# Patient Record
Sex: Female | Born: 1990 | Hispanic: Yes | Smoking: Never smoker
Health system: Southern US, Community
[De-identification: ages and names within clinical notes are randomized; demographics above are authoritative.]

## PROBLEM LIST (undated history)

## (undated) ENCOUNTER — Inpatient Hospital Stay (HOSPITAL_COMMUNITY): Payer: Self-pay

## (undated) DIAGNOSIS — R51 Headache: Secondary | ICD-10-CM

## (undated) DIAGNOSIS — D649 Anemia, unspecified: Secondary | ICD-10-CM

## (undated) DIAGNOSIS — E282 Polycystic ovarian syndrome: Secondary | ICD-10-CM

## (undated) HISTORY — PX: TONSILLECTOMY: SUR1361

---
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
## 2013-01-07 ENCOUNTER — Emergency Department (HOSPITAL_COMMUNITY)
Admission: EM | Admit: 2013-01-07 | Discharge: 2013-01-07 | Disposition: A | Discharge status: Home / Self Care | Payer: Self-pay | Attending: Emergency Medicine | Admitting: Emergency Medicine

## 2013-01-07 ENCOUNTER — Encounter (HOSPITAL_COMMUNITY): Payer: Self-pay | Admitting: *Deleted

## 2013-01-07 DIAGNOSIS — R509 Fever, unspecified: Secondary | ICD-10-CM | POA: Insufficient documentation

## 2013-01-07 DIAGNOSIS — J020 Streptococcal pharyngitis: Secondary | ICD-10-CM | POA: Insufficient documentation

## 2013-01-07 DIAGNOSIS — R51 Headache: Secondary | ICD-10-CM | POA: Insufficient documentation

## 2013-01-07 DIAGNOSIS — Z8742 Personal history of other diseases of the female genital tract: Secondary | ICD-10-CM | POA: Insufficient documentation

## 2013-01-07 DIAGNOSIS — IMO0001 Reserved for inherently not codable concepts without codable children: Secondary | ICD-10-CM | POA: Insufficient documentation

## 2013-01-07 HISTORY — DX: Polycystic ovarian syndrome: E28.2

## 2013-01-07 LAB — RAPID STREP SCREEN (MED CTR MEBANE ONLY): Streptococcus, Group A Screen (Direct): POSITIVE — AB

## 2013-01-07 MED ORDER — IBUPROFEN 800 MG PO TABS
800.0000 mg | ORAL_TABLET | Freq: Once | ORAL | Status: AC
  Administered 2013-01-07: 800 mg via ORAL
  Filled 2013-01-07: qty 1

## 2013-01-07 MED ORDER — PENICILLIN G BENZATHINE 1200000 UNIT/2ML IM SUSP
1.2000 10*6.[IU] | Freq: Once | INTRAMUSCULAR | Status: AC
  Administered 2013-01-07: 1.2 10*6.[IU] via INTRAMUSCULAR
  Filled 2013-01-07: qty 2

## 2013-01-07 NOTE — ED Provider Notes (Signed)
History     CSN: [id number]  Arrival date & time 01/07/13  1551   First MD Initiated Contact with Patient 01/07/13 1632      Chief Complaint  Patient presents with  . Sore Throat    (Consider location/radiation/quality/duration/timing/severity/associated sxs/prior treatment) Patient is a 22 y.o. female presenting with pharyngitis. The history is provided by the patient.  Sore Throat This is a new problem. The current episode started in the past 7 days. The problem occurs daily. The problem has been gradually worsening. Associated symptoms include chills, a fever, headaches, myalgias and a sore throat. Pertinent negatives include no abdominal pain, arthralgias, chest pain, coughing or neck pain. The symptoms are aggravated by swallowing. She has tried acetaminophen for the symptoms. The treatment provided no relief.    Past Medical History  Diagnosis Date  . PCOS (polycystic ovarian syndrome)     Past Surgical History  Procedure Date  . Tonsillectomy     History reviewed. No pertinent family history.  History  Substance Use Topics  . Smoking status: Never Smoker   . Smokeless tobacco: Not on file  . Alcohol Use: Yes    OB History    Grav Para Term Preterm Abortions TAB SAB Ect Mult Living                  Review of Systems  Constitutional: Positive for fever and chills. Negative for activity change.       All ROS Neg except as noted in HPI  HENT: Positive for sore throat. Negative for nosebleeds and neck pain.   Eyes: Negative for photophobia and discharge.  Respiratory: Negative for cough, shortness of breath and wheezing.   Cardiovascular: Negative for chest pain and palpitations.  Gastrointestinal: Negative for abdominal pain and blood in stool.  Genitourinary: Negative for dysuria, frequency and hematuria.  Musculoskeletal: Positive for myalgias. Negative for back pain and arthralgias.  Skin: Negative.   Neurological: Positive for headaches. Negative for  dizziness, seizures and speech difficulty.  Psychiatric/Behavioral: Negative for hallucinations and confusion.    Allergies  Review of patient's allergies indicates no known allergies.  Home Medications  No current outpatient prescriptions on file.  BP 134/81  Pulse 86  Temp 98.2 F (36.8 C) (Oral)  Resp 18  SpO2 100%  LMP 11/18/2012  Physical Exam  Nursing note and vitals reviewed. Constitutional: She is oriented to person, place, and time. She appears well-developed and well-nourished.  Non-toxic appearance.  HENT:  Head: Normocephalic.  Right Ear: Tympanic membrane and external ear normal.  Left Ear: Tympanic membrane and external ear normal.  Mouth/Throat: Uvula is midline. Posterior oropharyngeal erythema present.  Eyes: EOM and lids are normal. Pupils are equal, round, and reactive to light.  Neck: Normal range of motion. Neck supple. Carotid bruit is not present.  Cardiovascular: Normal rate, regular rhythm, normal heart sounds, intact distal pulses and normal pulses.   Pulmonary/Chest: Breath sounds normal. No respiratory distress.  Abdominal: Soft. Bowel sounds are normal. There is no tenderness. There is no guarding.  Musculoskeletal: Normal range of motion.  Lymphadenopathy:       Head (right side): No submandibular adenopathy present.       Head (left side): No submandibular adenopathy present.    She has no cervical adenopathy.  Neurological: She is alert and oriented to person, place, and time. She has normal strength. No cranial nerve deficit or sensory deficit.  Skin: Skin is warm and dry.  Psychiatric: She has a normal mood  and affect. Her speech is normal.    ED Course  Procedures (including critical care time)   Labs Reviewed  RAPID STREP SCREEN   No results found. Pulse Ox 100% on RA. WNL by my interpretation.  No diagnosis found.    MDM  I have reviewed nursing notes, vital signs, and all appropriate lab and imaging results for this  patient. Strep test is positive. Pt treated with Bicillin LA, and ibuprofen. Pt to use salt water gargles and ibuprofen. He has been instructed to wash hands frequently. She is to return if any changes or problem.       Kathie Dike, [place] 01/07/13 1750

## 2013-01-07 NOTE — ED Provider Notes (Signed)
Medical screening examination/treatment/procedure(s) were performed by non-physician practitioner and as supervising physician I was immediately available for consultation/collaboration.   Bernadine Melecio L Keaira Whitehurst, MD 01/07/13 2045 

## 2013-01-07 NOTE — ED Notes (Signed)
Sore throat for 1 week.fever last week,

## 2013-04-16 ENCOUNTER — Emergency Department (HOSPITAL_COMMUNITY): Payer: No Typology Code available for payment source

## 2013-04-16 ENCOUNTER — Encounter (HOSPITAL_COMMUNITY): Payer: Self-pay | Admitting: Emergency Medicine

## 2013-04-16 ENCOUNTER — Emergency Department (HOSPITAL_COMMUNITY)
Admission: EM | Admit: 2013-04-16 | Discharge: 2013-04-16 | Disposition: A | Discharge status: Home / Self Care | Payer: No Typology Code available for payment source | Attending: Emergency Medicine | Admitting: Emergency Medicine

## 2013-04-16 DIAGNOSIS — Z862 Personal history of diseases of the blood and blood-forming organs and certain disorders involving the immune mechanism: Secondary | ICD-10-CM | POA: Insufficient documentation

## 2013-04-16 DIAGNOSIS — S46909A Unspecified injury of unspecified muscle, fascia and tendon at shoulder and upper arm level, unspecified arm, initial encounter: Secondary | ICD-10-CM | POA: Insufficient documentation

## 2013-04-16 DIAGNOSIS — S4980XA Other specified injuries of shoulder and upper arm, unspecified arm, initial encounter: Secondary | ICD-10-CM | POA: Insufficient documentation

## 2013-04-16 DIAGNOSIS — S0993XA Unspecified injury of face, initial encounter: Secondary | ICD-10-CM | POA: Insufficient documentation

## 2013-04-16 DIAGNOSIS — IMO0002 Reserved for concepts with insufficient information to code with codable children: Secondary | ICD-10-CM | POA: Insufficient documentation

## 2013-04-16 DIAGNOSIS — S59919A Unspecified injury of unspecified forearm, initial encounter: Secondary | ICD-10-CM | POA: Insufficient documentation

## 2013-04-16 DIAGNOSIS — Y92410 Unspecified street and highway as the place of occurrence of the external cause: Secondary | ICD-10-CM | POA: Insufficient documentation

## 2013-04-16 DIAGNOSIS — Y9389 Activity, other specified: Secondary | ICD-10-CM | POA: Insufficient documentation

## 2013-04-16 DIAGNOSIS — S59909A Unspecified injury of unspecified elbow, initial encounter: Secondary | ICD-10-CM | POA: Insufficient documentation

## 2013-04-16 DIAGNOSIS — T148XXA Other injury of unspecified body region, initial encounter: Secondary | ICD-10-CM

## 2013-04-16 DIAGNOSIS — Z8639 Personal history of other endocrine, nutritional and metabolic disease: Secondary | ICD-10-CM | POA: Insufficient documentation

## 2013-04-16 DIAGNOSIS — S6990XA Unspecified injury of unspecified wrist, hand and finger(s), initial encounter: Secondary | ICD-10-CM | POA: Insufficient documentation

## 2013-04-16 MED ORDER — IBUPROFEN 800 MG PO TABS: 800.0000 mg | ORAL_TABLET | Freq: Three times a day (TID) | ORAL | Status: DC

## 2013-04-16 MED ORDER — OXYCODONE-ACETAMINOPHEN 5-325 MG PO TABS
2.0000 | ORAL_TABLET | Freq: Once | ORAL | Status: AC
  Administered 2013-04-16: 2 via ORAL
  Filled 2013-04-16: qty 2

## 2013-04-16 MED ORDER — OXYCODONE-ACETAMINOPHEN 5-325 MG PO TABS: 2.0000 | ORAL_TABLET | ORAL | Status: DC | PRN
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
## 2013-12-30 ENCOUNTER — Encounter (HOSPITAL_COMMUNITY): Payer: Self-pay | Admitting: Emergency Medicine

## 2013-12-30 ENCOUNTER — Emergency Department (HOSPITAL_COMMUNITY)
Admission: EM | Admit: 2013-12-30 | Discharge: 2013-12-30 | Disposition: A | Discharge status: Home / Self Care | Payer: No Typology Code available for payment source | Attending: Emergency Medicine | Admitting: Emergency Medicine

## 2013-12-30 DIAGNOSIS — Z791 Long term (current) use of non-steroidal anti-inflammatories (NSAID): Secondary | ICD-10-CM | POA: Insufficient documentation

## 2013-12-30 DIAGNOSIS — K118 Other diseases of salivary glands: Secondary | ICD-10-CM | POA: Insufficient documentation

## 2013-12-30 DIAGNOSIS — Z8742 Personal history of other diseases of the female genital tract: Secondary | ICD-10-CM | POA: Insufficient documentation

## 2013-12-30 DIAGNOSIS — R609 Edema, unspecified: Secondary | ICD-10-CM

## 2013-12-30 DIAGNOSIS — H9209 Otalgia, unspecified ear: Secondary | ICD-10-CM | POA: Insufficient documentation

## 2013-12-30 MED ORDER — CEPHALEXIN 500 MG PO CAPS: 500.0000 mg | ORAL_CAPSULE | Freq: Four times a day (QID) | ORAL | Status: DC

## 2013-12-30 NOTE — Discharge Instructions (Signed)
Parotitis °Parotitis is soreness and swelling (inflammation) of one or both parotid glands. The parotid glands produce saliva. They are located on each side of the face, below and in front of the earlobes. The saliva produced comes out of tiny openings (ducts) inside the cheeks. In most cases, parotitis goes away over time or with treatment. If your parotitis is caused by certain long-term (chronic) diseases, it may come back again.  °CAUSES  °Parotitis can be caused by: °· Viral infections. Mumps is one viral infection that can cause parotitis. °· Bacterial infections. °· Blockage of the salivary ducts due to a salivary stone. °· Narrowing of the salivary ducts. °· Swelling of the salivary ducts. °· Dehydration. °· Autoimmune conditions, such as sarcoidosis or Sjogren's syndrome. °· Air from activities such as scuba diving, glass blowing, or playing an instrument (rare). °· Human immunodeficiency virus (HIV) or acquired immunodeficiency syndrome (AIDS). °· Tuberculosis. °SYMPTOMS  °· The ears may appear to be pushed up and out from their normal position. °· Redness (erythema) of the skin over the parotid glands. °· Pain and tenderness over the parotid glands. °· Swelling in the parotid gland area. °· Yellowish-white fluid (pus) coming from the ducts inside the cheeks. °· Dry mouth. °· Bad taste in the mouth. °DIAGNOSIS  °Your caregiver may determine that you have parotitis based on your symptoms and a physical exam. A sample of fluid may also be taken from the parotid gland and tested to find the cause of your infection. X-rays or computed tomography (CT) scans may be taken if your caregiver thinks you might have a salivary stone blocking your salivary duct. °TREATMENT  °Treatment varies depending upon the cause of your parotitis. If your parotitis is caused by mumps, no treatment is needed. The condition will go away on its own after 7 to 10 days. In other cases, treatment may include: °· Antibiotics if your  infection was caused by bacteria. °· Pain medicines. °· Gland massage. °· Eating sour candy to increase your saliva production. °· Removal of salivary stones. Your caregiver may flush stones out with fluids or remove them with tweezers. °· Surgery to remove the parotid glands. °HOME CARE INSTRUCTIONS  °· If you were given antibiotics, take them as directed. Finish them even if you start to feel better. °· Put warm compresses on the sore area. °· Only take over-the-counter or prescription medicines for pain, discomfort, or fever as directed by your caregiver. °· Drink enough fluids to keep your urine clear or pale yellow. °SEEK IMMEDIATE MEDICAL CARE IF:  °· You have increasing pain or swelling that is not controlled with medicine. °· You have a fever. °MAKE SURE YOU: °· Understand these instructions. °· Will watch your condition. °· Will get help right away if you are not doing well or get worse. °Document Released: 06/02/2002 Document Revised: 03/04/2012 Document Reviewed: 11/06/2011 °ExitCare® Patient Information ©2014 [employer]. ° °

## 2013-12-30 NOTE — ED Notes (Signed)
Pt is here with left facial and ear swelling and tender to palpation

## 2013-12-30 NOTE — ED Notes (Signed)
Pt comfortable with d/c and f/u instructions. Prescriptions x1 

## 2013-12-30 NOTE — ED Provider Notes (Signed)
CSN: [id number]     Arrival date & time 12/30/13  1555 History  This chart was scribed for non-physician practitioner, Felicie Morn, NP-C working with Junius Argyle, MD by Greggory Stallion, ED scribe. This patient was seen in room TR06C/TR06C and the patient's care was started at 5:04 PM.   Chief Complaint  Patient presents with  . Facial Swelling   The history is provided by the patient. No language interpreter was used.   HPI Comments: Teresa Olsen is a 23 y.o. female who presents to the Emergency Department complaining of gradual onset, constant left sided facial and ear swelling that started a few days ago. She states she has started to have left ear pain due to the pressure. Eating sour things and chewing make it worse. She states she is pregnant but is unsure how far along she is. Denies fever, dental problem, abdominal pain, nausea, emesis.   Past Medical History  Diagnosis Date  . PCOS (polycystic ovarian syndrome)    Past Surgical History  Procedure Laterality Date  . Tonsillectomy     No family history on file. History  Substance Use Topics  . Smoking status: Never Smoker   . Smokeless tobacco: Not on file  . Alcohol Use: Yes   OB History   Grav Para Term Preterm Abortions TAB SAB Ect Mult Living   1              Review of Systems  Constitutional: Negative for fever.  HENT: Positive for ear pain and facial swelling. Negative for dental problem.   Gastrointestinal: Negative for nausea, vomiting and abdominal pain.  All other systems reviewed and are negative.    Allergies  Review of patient's allergies indicates no known allergies.  Home Medications   Current Outpatient Rx  Name  Route  Sig  Dispense  Refill  . acetaminophen (TYLENOL) 500 MG tablet   Oral   Take 1,000 mg by mouth daily as needed. For pain         . ibuprofen (ADVIL,MOTRIN) 800 MG tablet   Oral   Take 1 tablet (800 mg total) by mouth 3 (three) times daily.   21 tablet   0    . oxyCODONE-acetaminophen (PERCOCET/ROXICET) 5-325 MG per tablet   Oral   Take 2 tablets by mouth every 4 (four) hours as needed for pain.   20 tablet   0    BP 143/89  Pulse 87  Temp(Src) 98.4 F (36.9 C) (Oral)  Resp 20  Ht 5\' 5"  (1.651 m)  Wt 207 lb 3 oz (93.98 kg)  BMI 34.48 kg/m2  LMP 11/14/2013  Physical Exam  Nursing note and vitals reviewed. Constitutional: She is oriented to person, place, and time. She appears well-developed and well-nourished. No distress.  HENT:  Head: Normocephalic and atraumatic.  Right Ear: Tympanic membrane and ear canal normal.  Left Ear: Tympanic membrane and ear canal normal.  Mouth/Throat: Uvula is midline and oropharynx is clear and moist. Normal dentition. No dental caries.  Mild swelling noted to base of left mandible. Tender to palpation. Not warm to touch.   Eyes: EOM are normal.  Neck: Neck supple. No tracheal deviation present.  Cardiovascular: Normal rate, regular rhythm and normal heart sounds.   Pulmonary/Chest: Effort normal and breath sounds normal. No respiratory distress. She has no wheezes. She has no rales.  Musculoskeletal: Normal range of motion.  Neurological: She is alert and oriented to person, place, and time.  Skin: Skin is  warm and dry.  Psychiatric: She has a normal mood and affect. Her behavior is normal.    ED Course  Procedures (including critical care time)  COORDINATION OF CARE: 5:07 PM-Discussed treatment plan with pt at bedside and pt agreed to plan.   Labs Review Labs Reviewed - No data to display Imaging Review No results found.  EKG Interpretation   None     Afebrile, non-toxic appearing.  Patient discussed with and seen by Dr. Romeo AppleHarrison.  Will treat as parotitis.  MDM  Parotitis.   I personally performed the services described in this documentation, which was scribed in my presence. The recorded information has been reviewed and is accurate.  Jimmye Normanavid John Lyndsi Altic, NP 12/31/13 [phone]

## 2013-12-31 NOTE — ED Provider Notes (Signed)
Medical screening examination/treatment/procedure(s) were conducted as a shared visit with non-physician practitioner(s) and myself.  I personally evaluated the patient during the encounter.  EKG Interpretation   None       I interviewed and examined the patient. Lungs are CTAB. Cardiac exam wnl. Abdomen soft. Mild swelling to left pre-auricular area w/ mild ttp. Normal rom of neck. No trismus. Normal appearing oropharynx. Doubt abscess. I suspect she has parotitis. She also notes pos preg test w/in the last few weeks. VS unremarkable. She denies fevers. Will tx w/ keflex and provide strong return precautions for any difficulty swallowing, inc swelling, redness, fever.   Junius ArgyleForrest S Glenwood Revoir, MD 12/31/13 1116

## 2014-01-07 ENCOUNTER — Ambulatory Visit (INDEPENDENT_AMBULATORY_CARE_PROVIDER_SITE_OTHER): Payer: Self-pay

## 2014-01-07 DIAGNOSIS — N926 Irregular menstruation, unspecified: Secondary | ICD-10-CM

## 2014-01-07 DIAGNOSIS — Z3201 Encounter for pregnancy test, result positive: Secondary | ICD-10-CM

## 2014-01-07 LAB — POCT PREGNANCY, URINE: PREG TEST UR: POSITIVE — AB

## 2014-01-07 NOTE — Progress Notes (Signed)
Pt. Came in today for a pregnancy test-- first day of last period was November 21st. Pregnancy test positive. Pt. Will come here for care. New OB labs done today. Anatomy ultrasound scheduled for 1030 on Thursday 03/26/14 . Letter confirming pregnancy given.

## 2014-01-08 LAB — OBSTETRIC PANEL
ANTIBODY SCREEN: NEGATIVE
BASOS ABS: 0 10*3/uL (ref 0.0–0.1)
Basophils Relative: 0 % (ref 0–1)
Eosinophils Absolute: 0.1 10*3/uL (ref 0.0–0.7)
Eosinophils Relative: 1 % (ref 0–5)
HEMATOCRIT: 35.9 % — AB (ref 36.0–46.0)
HEMOGLOBIN: 12.4 g/dL (ref 12.0–15.0)
HEP B S AG: NEGATIVE
LYMPHS PCT: 29 % (ref 12–46)
Lymphs Abs: 2.9 10*3/uL (ref 0.7–4.0)
MCH: 31.3 pg (ref 26.0–34.0)
MCHC: 34.5 g/dL (ref 30.0–36.0)
MCV: 90.7 fL (ref 78.0–100.0)
MONO ABS: 0.6 10*3/uL (ref 0.1–1.0)
MONOS PCT: 6 % (ref 3–12)
NEUTROS ABS: 6.4 10*3/uL (ref 1.7–7.7)
Neutrophils Relative %: 64 % (ref 43–77)
Platelets: 325 10*3/uL (ref 150–400)
RBC: 3.96 MIL/uL (ref 3.87–5.11)
RDW: 13.9 % (ref 11.5–15.5)
RH TYPE: POSITIVE
RUBELLA: 15.4 {index} — AB (ref <0.90)
WBC: 10 10*3/uL (ref 4.0–10.5)

## 2014-01-08 LAB — HIV ANTIBODY (ROUTINE TESTING W REFLEX): HIV: NONREACTIVE

## 2014-01-09 LAB — HEMOGLOBINOPATHY EVALUATION
HEMOGLOBIN OTHER: 0 %
HGB F QUANT: 0 % (ref 0.0–2.0)
Hgb A2 Quant: 2.7 % (ref 2.2–3.2)
Hgb A: 97.3 % (ref 96.8–97.8)
Hgb S Quant: 0 %

## 2014-01-29 ENCOUNTER — Inpatient Hospital Stay (HOSPITAL_COMMUNITY)
Admission: AD | Admit: 2014-01-29 | Discharge: 2014-01-29 | Disposition: A | Discharge status: Home / Self Care | Payer: Self-pay | Source: Ambulatory Visit | Attending: Obstetrics & Gynecology | Admitting: Obstetrics & Gynecology

## 2014-01-29 ENCOUNTER — Inpatient Hospital Stay (HOSPITAL_COMMUNITY): Payer: Self-pay

## 2014-01-29 ENCOUNTER — Encounter (HOSPITAL_COMMUNITY): Payer: Self-pay

## 2014-01-29 DIAGNOSIS — O209 Hemorrhage in early pregnancy, unspecified: Secondary | ICD-10-CM

## 2014-01-29 DIAGNOSIS — M549 Dorsalgia, unspecified: Secondary | ICD-10-CM | POA: Insufficient documentation

## 2014-01-29 DIAGNOSIS — O210 Mild hyperemesis gravidarum: Secondary | ICD-10-CM | POA: Insufficient documentation

## 2014-01-29 HISTORY — DX: Headache: R51

## 2014-01-29 LAB — CBC
HEMATOCRIT: 35.4 % — AB (ref 36.0–46.0)
Hemoglobin: 12.5 g/dL (ref 12.0–15.0)
MCH: 31 pg (ref 26.0–34.0)
MCHC: 35.3 g/dL (ref 30.0–36.0)
MCV: 87.8 fL (ref 78.0–100.0)
PLATELETS: 250 10*3/uL (ref 150–400)
RBC: 4.03 MIL/uL (ref 3.87–5.11)
RDW: 12.7 % (ref 11.5–15.5)
WBC: 9 10*3/uL (ref 4.0–10.5)

## 2014-01-29 LAB — URINALYSIS, ROUTINE W REFLEX MICROSCOPIC
Bilirubin Urine: NEGATIVE
GLUCOSE, UA: NEGATIVE mg/dL
Ketones, ur: NEGATIVE mg/dL
LEUKOCYTES UA: NEGATIVE
NITRITE: NEGATIVE
PROTEIN: NEGATIVE mg/dL
Specific Gravity, Urine: 1.02 (ref 1.005–1.030)
Urobilinogen, UA: 0.2 mg/dL (ref 0.0–1.0)
pH: 6 (ref 5.0–8.0)

## 2014-01-29 LAB — URINE MICROSCOPIC-ADD ON

## 2014-01-29 LAB — HCG, QUANTITATIVE, PREGNANCY: HCG, BETA CHAIN, QUANT, S: 10538 m[IU]/mL — AB (ref <5)

## 2014-01-29 LAB — WET PREP, GENITAL
Clue Cells Wet Prep HPF POC: NONE SEEN
TRICH WET PREP: NONE SEEN
YEAST WET PREP: NONE SEEN

## 2014-01-29 NOTE — MAU Note (Signed)
Patient states she had spotting with wiping yesterday, stopped this am then started again when the back started to hurt. Patient states she has nausea and vomiting almost all the time. States she has a 7 pound weight loss. Denies S/S of the flu.

## 2014-01-29 NOTE — Discharge Instructions (Signed)
Vaginal Bleeding During Pregnancy, First Trimester °A small amount of bleeding (spotting) from the vagina is relatively common in early pregnancy. It usually stops on its own. Various things may cause bleeding or spotting in early pregnancy. Some bleeding may be related to the pregnancy, and some may not. In most cases, the bleeding is normal and is not a problem. However, bleeding can also be a sign of something serious. Be sure to tell your health care provider about any vaginal bleeding right away. °Some possible causes of vaginal bleeding during the first trimester include: °· Infection or inflammation of the cervix. °· Growths (polyps) on the cervix. °· Miscarriage or threatened miscarriage. °· Pregnancy tissue has developed outside of the uterus and in a fallopian tube (tubal pregnancy). °· Tiny cysts have developed in the uterus instead of pregnancy tissue (molar pregnancy). °HOME CARE INSTRUCTIONS  °Watch your condition for any changes. The following actions may help to lessen any discomfort you are feeling: °· Follow your health care provider's instructions for limiting your activity. If your health care provider orders bed rest, you may need to stay in bed and only get up to use the bathroom. However, your health care provider may allow you to continue light activity. °· If needed, make plans for someone to help with your regular activities and responsibilities while you are on bed rest. °· Keep track of the number of pads you use each day, how often you change pads, and how soaked (saturated) they are. Write this down. °· Do not use tampons. Do not douche. °· Do not have sexual intercourse or orgasms until approved by your health care provider. °· If you pass any tissue from your vagina, save the tissue so you can show it to your health care provider. °· Only take over-the-counter or prescription medicines as directed by your health care provider. °· Do not take aspirin because it can make you  bleed. °· Keep all follow-up appointments as directed by your health care provider. °SEEK MEDICAL CARE IF: °· You have any vaginal bleeding during any part of your pregnancy. °· You have cramps or labor pains. °SEEK IMMEDIATE MEDICAL CARE IF:  °· You have severe cramps in your back or belly (abdomen). °· You have a fever, not controlled by medicine. °· You pass large clots or tissue from your vagina. °· Your bleeding increases. °· You feel lightheaded or weak, or you have fainting episodes. °· You have chills. °· You are leaking fluid or have a gush of fluid from your vagina. °· You pass out while having a bowel movement. °MAKE SURE YOU: °· Understand these instructions. °· Will watch your condition. °· Will get help right away if you are not doing well or get worse. °Document Released: 09/20/2005 Document Revised: 10/01/2013 Document Reviewed: 08/18/2013 °[employer]® Patient Information ©2014 [employer]. ° °

## 2014-01-29 NOTE — MAU Provider Note (Signed)
Chief Complaint: Vaginal Bleeding, Back Pain and Morning Sickness   First Provider Initiated Contact with Patient 01/29/14 1910     SUBJECTIVE HPI: Teresa Olsen is a 23 y.o. G2P1001 at 1750w6d by LMP who presents to maternity admissions reporting bright red vaginal bleeding.  Bleeding started 2 days ago which was light when wiping, then stopped until this afternoon when it became heavier and soaked her underwear.  She also reports daily nausea.  She has not taken any medications for her nausea. She reports some constant back pain, denies abdominal pain, vaginal itching/burning, urinary symptoms, h/a, dizziness, or fever/chills.     Past Medical History  Diagnosis Date  . PCOS (polycystic ovarian syndrome)   . BJYNWGNF(621.3Headache(784.0)    Past Surgical History  Procedure Laterality Date  . Tonsillectomy     History   Social History  . Marital Status: Married    Spouse Name: N/A    Number of Children: N/A  . Years of Education: N/A   Occupational History  . Not on file.   Social History Main Topics  . Smoking status: Never Smoker   . Smokeless tobacco: Never Used  . Alcohol Use: No  . Drug Use: No  . Sexual Activity: Yes    Birth Control/ Protection: None   Other Topics Concern  . Not on file   Social History Narrative  . No narrative on file   No current facility-administered medications on file prior to encounter.   Current Outpatient Prescriptions on File Prior to Encounter  Medication Sig Dispense Refill  . acetaminophen (TYLENOL) 500 MG tablet Take 1,000 mg by mouth daily as needed for mild pain or moderate pain.        No Known Allergies  ROS: Pertinent items in HPI  OBJECTIVE Blood pressure 119/73, pulse 117, temperature 99.3 F (37.4 C), temperature source Oral, resp. rate 18, height 5' 3.5" (1.613 m), weight 91.445 kg (201 lb 9.6 oz), last menstrual period 11/14/2013, SpO2 100.00%. GENERAL: Well-developed, well-nourished female in no acute distress.   HEENT: Normocephalic HEART: normal rate RESP: normal effort ABDOMEN: Soft, non-tender EXTREMITIES: Nontender, no edema NEURO: Alert and oriented Pelvic exam: Cervix pink, visually closed, without lesion, small amount dark red bleeding with small clots, vaginal walls and external genitalia normal Bimanual exam: Cervix 0/long/high, firm, anterior, neg CMT, uterus nontender, ~7 weeks size, adnexa without tenderness, enlargement, or mass   LAB RESULTS Results for orders placed during the hospital encounter of 01/29/14 (from the past 24 hour(s))  URINALYSIS, ROUTINE W REFLEX MICROSCOPIC     Status: Abnormal   Collection Time    01/29/14  6:05 PM      Result Value Range   Color, Urine YELLOW  YELLOW   APPearance HAZY (*) CLEAR   Specific Gravity, Urine 1.020  1.005 - 1.030   pH 6.0  5.0 - 8.0   Glucose, UA NEGATIVE  NEGATIVE mg/dL   Hgb urine dipstick LARGE (*) NEGATIVE   Bilirubin Urine NEGATIVE  NEGATIVE   Ketones, ur NEGATIVE  NEGATIVE mg/dL   Protein, ur NEGATIVE  NEGATIVE mg/dL   Urobilinogen, UA 0.2  0.0 - 1.0 mg/dL   Nitrite NEGATIVE  NEGATIVE   Leukocytes, UA NEGATIVE  NEGATIVE  URINE MICROSCOPIC-ADD ON     Status: Abnormal   Collection Time    01/29/14  6:05 PM      Result Value Range   Squamous Epithelial / LPF RARE  RARE   WBC, UA 3-6  <3 WBC/hpf  RBC / HPF 21-50  <3 RBC/hpf   Bacteria, UA FEW (*) RARE  CBC     Status: Abnormal   Collection Time    01/29/14  7:20 PM      Result Value Range   WBC 9.0  4.0 - 10.5 K/uL   RBC 4.03  3.87 - 5.11 MIL/uL   Hemoglobin 12.5  12.0 - 15.0 g/dL   HCT 95.6 (*) 21.3 - 08.6 %   MCV 87.8  78.0 - 100.0 fL   MCH 31.0  26.0 - 34.0 pg   MCHC 35.3  30.0 - 36.0 g/dL   RDW 57.8  46.9 - 62.9 %   Platelets 250  150 - 400 K/uL  HCG, QUANTITATIVE, PREGNANCY     Status: Abnormal   Collection Time    01/29/14  7:20 PM      Result Value Range   hCG, Beta Chain, Quant, S 10538 (*) <5 mIU/mL  WET PREP, GENITAL     Status: Abnormal    Collection Time    01/29/14  7:20 PM      Result Value Range   Yeast Wet Prep HPF POC NONE SEEN  NONE SEEN   Trich, Wet Prep NONE SEEN  NONE SEEN   Clue Cells Wet Prep HPF POC NONE SEEN  NONE SEEN   WBC, Wet Prep HPF POC FEW (*) NONE SEEN    IMAGING US Ob Comp Less 14 Wks  01/29/2014   CLINICAL DATA:  Pregnant patient with pelvic pain.  EXAM: OBSTETRIC <14 WK [place] AND TRANSVAGINAL OB US  TECHNIQUE: Both transabdominal and transvaginal ultrasound examinations were performed for complete evaluation of the gestation as well as the maternal uterus, adnexal regions, and pelvic cul-de-sac. Transvaginal technique was performed to assess early pregnancy.  COMPARISON:  None.  FINDINGS: Intrauterine gestational sac: The gestational sac has irregular borders and there is some debris within the sac.  Yolk sac:  Visualized.  Embryo:  Not visualized.  Cardiac Activity: Not applicable.  MSD:  1.66 cm mm   6 w   4  d   [place] EDC: 09/20/2014.  Maternal uterus/adnexae: Unremarkable.  No pelvic fluid.  IMPRESSION: Gestational sac within the uterus has irregular borders and there is debris within the sac worrisome for failed pregnancy. Recommend serial quantitative HCG. Repeat ultrasound in 7-14 days is also recommended.   Electronically Signed   By: Drusilla Kanner M.D.   On: 01/29/2014 20:34   US Ob Transvaginal  01/29/2014   CLINICAL DATA:  Pregnant patient with pelvic pain.  EXAM: OBSTETRIC <14 WK [place] AND TRANSVAGINAL OB US  TECHNIQUE: Both transabdominal and transvaginal ultrasound examinations were performed for complete evaluation of the gestation as well as the maternal uterus, adnexal regions, and pelvic cul-de-sac. Transvaginal technique was performed to assess early pregnancy.  COMPARISON:  None.  FINDINGS: Intrauterine gestational sac: The gestational sac has irregular borders and there is some debris within the sac.  Yolk sac:  Visualized.  Embryo:  Not visualized.  Cardiac Activity: Not applicable.  MSD:  1.66 cm  mm   6 w   4  d   [place] EDC: 09/20/2014.  Maternal uterus/adnexae: Unremarkable.  No pelvic fluid.  IMPRESSION: Gestational sac within the uterus has irregular borders and there is debris within the sac worrisome for failed pregnancy. Recommend serial quantitative HCG. Repeat ultrasound in 7-14 days is also recommended.   Electronically Signed   By: Drusilla Kanner M.D.   On: 01/29/2014 20:34  ASSESSMENT 1. Vaginal bleeding in pregnant patient at less than [redacted] weeks gestation     PLAN Discharge home with bleeding precautions Return to MAU on Saturday for repeat quant hcg    Medication List         acetaminophen 500 MG tablet  Commonly known as:  TYLENOL  Take 1,000 mg by mouth daily as needed for mild pain or moderate pain.       Follow-up Information   Follow up with [facility] [place] [facility]. (Return to MAU on Saturday after 8 pm for labs.  Return sooner as needed.)    Contact information:   [address] [id number] [place] [address] [phone]      Sharen Counter Certified Nurse-Midwife 01/29/2014  9:11 PM

## 2014-01-29 NOTE — MAU Note (Signed)
Bleeding started on Tues night, initially just would see it when she wiped. Now is heavier and soaking through panties.. Having ongoing N/V, had that with first preg- almost entire preg.

## 2014-01-30 LAB — GC/CHLAMYDIA PROBE AMP
CT Probe RNA: NEGATIVE
GC Probe RNA: NEGATIVE

## 2014-01-31 ENCOUNTER — Encounter (HOSPITAL_COMMUNITY): Payer: Self-pay

## 2014-01-31 ENCOUNTER — Inpatient Hospital Stay (HOSPITAL_COMMUNITY)
Admission: AD | Admit: 2014-01-31 | Discharge: 2014-01-31 | Disposition: A | Discharge status: Home / Self Care | Payer: Self-pay | Source: Ambulatory Visit | Attending: Obstetrics & Gynecology | Admitting: Obstetrics & Gynecology

## 2014-01-31 DIAGNOSIS — O2340 Unspecified infection of urinary tract in pregnancy, unspecified trimester: Secondary | ICD-10-CM

## 2014-01-31 DIAGNOSIS — O039 Complete or unspecified spontaneous abortion without complication: Secondary | ICD-10-CM

## 2014-01-31 DIAGNOSIS — O2390 Unspecified genitourinary tract infection in pregnancy, unspecified trimester: Secondary | ICD-10-CM | POA: Insufficient documentation

## 2014-01-31 DIAGNOSIS — N390 Urinary tract infection, site not specified: Secondary | ICD-10-CM | POA: Insufficient documentation

## 2014-01-31 LAB — CBC
HEMATOCRIT: 35.1 % — AB (ref 36.0–46.0)
HEMOGLOBIN: 12.2 g/dL (ref 12.0–15.0)
MCH: 31 pg (ref 26.0–34.0)
MCHC: 34.8 g/dL (ref 30.0–36.0)
MCV: 89.1 fL (ref 78.0–100.0)
Platelets: 253 10*3/uL (ref 150–400)
RBC: 3.94 MIL/uL (ref 3.87–5.11)
RDW: 12.6 % (ref 11.5–15.5)
WBC: 8.1 10*3/uL (ref 4.0–10.5)

## 2014-01-31 LAB — URINE CULTURE: Colony Count: >=100000

## 2014-01-31 LAB — HCG, QUANTITATIVE, PREGNANCY: HCG, BETA CHAIN, QUANT, S: 8991 m[IU]/mL — AB (ref <5)

## 2014-01-31 MED ORDER — CEPHALEXIN 500 MG PO CAPS: 500.0000 mg | ORAL_CAPSULE | Freq: Four times a day (QID) | ORAL | Status: DC

## 2014-01-31 MED ORDER — IBUPROFEN 600 MG PO TABS: 600.0000 mg | ORAL_TABLET | Freq: Four times a day (QID) | ORAL | Status: DC | PRN

## 2014-01-31 NOTE — Discharge Instructions (Signed)
Incomplete Miscarriage °A miscarriage is the sudden loss of an unborn baby (fetus) before the 20th week of pregnancy. In an incomplete miscarriage, parts of the fetus or placenta (afterbirth) remain in the body.  °Having a miscarriage can be an emotional experience. Talk with your health care provider about any questions you may have about miscarrying, the grieving process, and your future pregnancy plans. °CAUSES  °· Problems with the fetal chromosomes that make it impossible for the baby to develop normally. Problems with the baby's genes or chromosomes are most often the result of errors that occur by chance as the embryo divides and grows. The problems are not inherited from the parents. °· Infection of the cervix or uterus. °· Hormone problems. °· Problems with the cervix, such as having an incompetent cervix. This is when the tissue in the cervix is not strong enough to hold the pregnancy. °· Problems with the uterus, such as an abnormally shaped uterus, uterine fibroids, or congenital abnormalities. °· Certain medical conditions. °· Smoking, drinking alcohol, or taking illegal drugs. °· Trauma. °SYMPTOMS  °· Vaginal bleeding or spotting, with or without cramps or pain. °· Pain or cramping in the abdomen or lower back. °· Passing fluid, tissue, or blood clots from the vagina. °DIAGNOSIS  °Your health care provider will perform a physical exam. You may also have an ultrasound to confirm the miscarriage. Blood or urine tests may also be ordered. °TREATMENT  °· Usually, a dilation and curettage (D&C) procedure is performed. During a D&C procedure, the cervix is widened (dilated) and any remaining fetal or placental tissue is gently removed from the uterus. °· Antibiotic medicines are prescribed if there is an infection. Other medicines may be given to reduce the size of the uterus (contract) if there is a lot of bleeding. °· If you have Rh negative blood and your baby was Rh positive, you will need an Rho(D)  immune globulin shot. This shot will protect any future baby from having Rh blood problems in future pregnancies. °· You may be confined to bed rest. This means you should stay in bed and only get up to use the bathroom. °HOME CARE INSTRUCTIONS  °· Rest as directed by your health care provider. °· Restrict activity as directed by your health care provider. You may be allowed to continue light activity if curettage was not done but you require further treatment. °· Keep track of the number of pads you use each day. Keep track of how soaked (saturated) they are. Record this information. °· Do not  use tampons. °· Do not douche or have sexual intercourse until approved by your health care provider. °· Keep all follow-up appointments for re-evaluation and continuing management. °· Only take over-the-counter or prescription medicines for pain, fever, or discomfort as directed by your health care provider. °· Take antibiotic medicine as directed by your health care provider. Make sure you finish it even if you start to feel better. °SEEK IMMEDIATE MEDICAL CARE IF:  °· You experience severe cramps in your stomach, back, or abdomen. °· You have an unexplained temperature (make sure to record these temperatures). °· You pass large clots or tissue (save these for your health care provider to inspect). °· Your bleeding increases. °· You become light-headed, weak, or have fainting episodes. °MAKE SURE YOU:  °· Understand these instructions. °· Will watch your condition. °· Will get help right away if you are not doing well or get worse. °Document Released: 12/11/2005 Document Revised: 10/01/2013 Document Reviewed: 07/10/2013 °  [employer]® Patient Information ©2014 [employer]. ° °

## 2014-01-31 NOTE — MAU Note (Signed)
Vag Bleeding "like a period", passed a few clots  and having cramps. Here for repeat labs.

## 2014-01-31 NOTE — MAU Provider Note (Signed)
S: 23 y.o. G2P1001 @[redacted]w[redacted]d  by LMP presented to MAU for repeat quant hcg.  On 01/29/14, she presented to MAU with bright red bleeding and had quant hcg 10538 and ultrasound with likely failed pregnancy, gestational and yolk sac present.  Pt reports she has had increase in bleeding, and some small clots over last 2 days, is changing pad every 2-4 hours.  She denies dizziness, n/v, or fever/chills.    O: BP 122/71  Pulse 58  Temp(Src) 98.1 F (36.7 C) (Oral)  Resp 18  LMP 11/14/2013  Results for orders placed during the hospital encounter of 01/31/14 (from the past 168 hour(s))  CBC   Collection Time    01/31/14  8:55 PM      Result Value Range   WBC 8.1  4.0 - 10.5 K/uL   RBC 3.94  3.87 - 5.11 MIL/uL   Hemoglobin 12.2  12.0 - 15.0 g/dL   HCT 91.435.1 (*) 78.236.0 - 95.646.0 %   MCV 89.1  78.0 - 100.0 fL   MCH 31.0  26.0 - 34.0 pg   MCHC 34.8  30.0 - 36.0 g/dL   RDW 21.312.6  08.611.5 - 57.815.5 %   Platelets 253  150 - 400 K/uL  HCG, QUANTITATIVE, PREGNANCY   Collection Time    01/31/14  8:55 PM      Result Value Range   hCG, Beta Chain, Quant, S 8991 (*) <5 mIU/mL  Results for orders placed during the hospital encounter of 01/29/14 (from the past 168 hour(s))  URINE CULTURE   Collection Time    01/29/14  6:05 PM      Result Value Range   Specimen Description URINE, CLEAN CATCH     Special Requests NONE     Culture  Setup Time       Value: 01/30/2014 00:29     Performed at [facility]   Colony Count       Value: >=100,000 COLONIES/ML     [facility]formed at [facility]   Culture       Value: ESCHERICHIA COLI     Performed at [facility]   Report Status 01/31/2014 FINAL     Organism ID, Bacteria ESCHERICHIA COLI    CBC   Collection Time    01/29/14  7:20 PM      Result Value Range   WBC 9.0  4.0 - 10.5 K/uL   RBC 4.03  3.87 - 5.11 MIL/uL   Hemoglobin 12.5  12.0 - 15.0 g/dL   HCT 46.935.4 (*) 62.936.0 - 52.846.0 %   MCV 87.8  78.0 - 100.0 fL   MCH 31.0  26.0 - 34.0 pg   MCHC 35.3   30.0 - 36.0 g/dL   RDW 41.312.7  24.411.5 - 01.015.5 %   Platelets 250  150 - 400 K/uL  HCG, QUANTITATIVE, PREGNANCY   Collection Time    01/29/14  7:20 PM      Result Value Range   hCG, Beta Chain, Quant, S 10538 (*) <5 mIU/mL   [place] Ob Comp Less 14 Wks  01/29/2014   CLINICAL DATA:  Pregnant patient with pelvic pain.  EXAM: OBSTETRIC <14 WK US AND TRANSVAGINAL OB US  TECHNIQUE: Both transabdominal and transvaginal ultrasound examinations were performed for complete evaluation of the gestation as well as the maternal uterus, adnexal regions, and pelvic cul-de-sac. Transvaginal technique was performed to assess early pregnancy.  COMPARISON:  None.  FINDINGS: Intrauterine gestational sac: The gestational sac has irregular borders  and there is some debris within the sac.  Yolk sac:  Visualized.  Embryo:  Not visualized.  Cardiac Activity: Not applicable.  MSD:  1.66 cm mm   6 w   4  d   [place] EDC: 09/20/2014.  Maternal uterus/adnexae: Unremarkable.  No pelvic fluid.  IMPRESSION: Gestational sac within the uterus has irregular borders and there is debris within the sac worrisome for failed pregnancy. Recommend serial quantitative HCG. Repeat ultrasound in 7-14 days is also recommended.   Electronically Signed   By: Drusilla Kanner M.D.   On: 01/29/2014 20:34   US Ob Transvaginal  01/29/2014   CLINICAL DATA:  Pregnant patient with pelvic pain.  EXAM: OBSTETRIC <14 WK [place] AND TRANSVAGINAL OB US  TECHNIQUE: Both transabdominal and transvaginal ultrasound examinations were performed for complete evaluation of the gestation as well as the maternal uterus, adnexal regions, and pelvic cul-de-sac. Transvaginal technique was performed to assess early pregnancy.  COMPARISON:  None.  FINDINGS: Intrauterine gestational sac: The gestational sac has irregular borders and there is some debris within the sac.  Yolk sac:  Visualized.  Embryo:  Not visualized.  Cardiac Activity: Not applicable.  MSD:  1.66 cm mm   6 w   4  d   [place] EDC:  09/20/2014.  Maternal uterus/adnexae: Unremarkable.  No pelvic fluid.  IMPRESSION: Gestational sac within the uterus has irregular borders and there is debris within the sac worrisome for failed pregnancy. Recommend serial quantitative HCG. Repeat ultrasound in 7-14 days is also recommended.   Electronically Signed   By: Drusilla Kanner M.D.   On: 01/29/2014 20:34    A:  1. SAB (spontaneous abortion)   2. UTI in pregnancy    P: D/C home with bleeding precautions Keflex 500 mg QID x 7 days, Ibuprofen 600 mg Q6 hours F/U in clinic in 1-2 weeks for quant hcg Comfort packet given by RN Return to MAU as needed  Sharen Counter Certified Nurse-Midwife

## 2014-02-04 ENCOUNTER — Encounter: Payer: Self-pay | Admitting: Family Medicine

## 2014-02-06 ENCOUNTER — Other Ambulatory Visit: Payer: Self-pay

## 2014-02-06 DIAGNOSIS — O039 Complete or unspecified spontaneous abortion without complication: Secondary | ICD-10-CM

## 2014-02-06 LAB — HCG, QUANTITATIVE, PREGNANCY: hCG, Beta Chain, Quant, S: 284.2 m[IU]/mL

## 2014-02-10 ENCOUNTER — Telehealth: Payer: Self-pay | Admitting: General Practice

## 2014-02-10 NOTE — Telephone Encounter (Signed)
Message copied by Kathee DeltonHILLMAN, Cleon Thoma L on Tue Feb 10, 2014  3:15 PM ------      Message from: Adam PhenixARNOLD, JAMES G      Created: Mon Feb 09, 2014  9:25 AM       Clinic f/u appt 1-2 weeks ------

## 2014-02-10 NOTE — Telephone Encounter (Signed)
Patient called and left message stating she would like her test results. Called patient back and informed patient of decreasing b-hcg. Patient verbalized understanding and stated she already had a followup appt scheduled on 2/27 to see a provider. Patient had no further questions

## 2014-02-20 ENCOUNTER — Ambulatory Visit: Payer: Self-pay | Admitting: Obstetrics & Gynecology

## 2014-03-26 ENCOUNTER — Ambulatory Visit (HOSPITAL_COMMUNITY): Payer: Self-pay | Attending: Obstetrics & Gynecology

## 2014-04-22 ENCOUNTER — Ambulatory Visit (INDEPENDENT_AMBULATORY_CARE_PROVIDER_SITE_OTHER): Payer: Self-pay

## 2014-04-22 DIAGNOSIS — N390 Urinary tract infection, site not specified: Secondary | ICD-10-CM

## 2014-04-22 LAB — POCT URINALYSIS DIP (DEVICE)
BILIRUBIN URINE: NEGATIVE
Glucose, UA: NEGATIVE mg/dL
Hgb urine dipstick: NEGATIVE
KETONES UR: NEGATIVE mg/dL
LEUKOCYTES UA: NEGATIVE
Nitrite: NEGATIVE
PH: 5.5 (ref 5.0–8.0)
PROTEIN: NEGATIVE mg/dL
Urobilinogen, UA: 0.2 mg/dL (ref 0.0–1.0)

## 2014-04-22 LAB — PREGNANCY, URINE: PREG TEST UR: POSITIVE

## 2014-04-22 NOTE — Progress Notes (Signed)
Pt left urine due to patient feeling like she had a UTI due to lower back pain.  UA results normal.  Called pt and informed pt of normal results.  Pt stated that she is pregnant and wanted to know if it was normal to have back pain this early.  I advised pt that it can be normal but to continue to monitor for vaginal bleeding.  If she has any questions to please our office a call.  Pt stated understanding.

## 2014-05-14 ENCOUNTER — Telehealth: Payer: Self-pay

## 2014-05-14 NOTE — Telephone Encounter (Signed)
Pt called and stated that she is having really bad morning sickness and would like an Rx for it.

## 2014-05-15 MED ORDER — PROMETHAZINE HCL 25 MG PO TABS: 25.0000 mg | ORAL_TABLET | Freq: Four times a day (QID) | ORAL | Status: DC | PRN

## 2014-05-15 NOTE — Addendum Note (Signed)
Addended by: Jill Side on: 05/15/2014 01:06 PM   Modules accepted: Orders

## 2014-05-15 NOTE — Telephone Encounter (Addendum)
Prior to calling pt I discussed the situation with Dr. Erin Fulling and received Rx for Phenergan. I then called pt and discussed her symptoms. She states she has vomited 3 times today. I offered that she may go to MAU if she thinks she is dehydrated or she may try the Rx first.  Pt states she will try the mediation and go to MAU if the problem does not improve or worsens. Rx sent to pharmacy. Pt advised to take sips of liquids and eat small amounts of light food items. She voiced understanding.

## 2014-05-20 ENCOUNTER — Encounter: Payer: Self-pay | Admitting: *Deleted

## 2014-05-29 ENCOUNTER — Telehealth: Payer: Self-pay | Admitting: *Deleted

## 2014-05-29 DIAGNOSIS — O219 Vomiting of pregnancy, unspecified: Secondary | ICD-10-CM

## 2014-05-29 MED ORDER — PROMETHAZINE HCL 25 MG PO TABS: 25.0000 mg | ORAL_TABLET | Freq: Four times a day (QID) | ORAL | Status: DC | PRN

## 2014-05-29 NOTE — Telephone Encounter (Signed)
Patient called and left message stating she missed our phone call and would like her medication refilled. Received verbal order from Raynelle Fanning to refill phenergan. Informed patient of refill sent to pharmacy. Patient verbalized understanding and had no further questions

## 2014-05-29 NOTE — Telephone Encounter (Signed)
Pt called nurse line requesting medication refill.

## 2014-06-02 ENCOUNTER — Telehealth: Payer: Self-pay | Admitting: *Deleted

## 2014-06-02 NOTE — Telephone Encounter (Signed)
Pt left message stating that she thinks she may have a UTI. Her urine is cloudy and has an odor. Her next clinic appt is 6/17. She requested a call back. I returned pt's call and informed her that we need to test a urine sample. She is not able to come to clinic today but will come in tomorrow @ 0900. I advised pt that we will need her to wait while we test her specimen and then speak to the doctor if Rx is needed.  Pt voiced understanding.

## 2014-06-03 ENCOUNTER — Ambulatory Visit (INDEPENDENT_AMBULATORY_CARE_PROVIDER_SITE_OTHER): Payer: Self-pay | Admitting: *Deleted

## 2014-06-03 DIAGNOSIS — R350 Frequency of micturition: Secondary | ICD-10-CM

## 2014-06-03 DIAGNOSIS — Z3490 Encounter for supervision of normal pregnancy, unspecified, unspecified trimester: Secondary | ICD-10-CM

## 2014-06-03 LAB — POCT URINALYSIS DIP (DEVICE)
BILIRUBIN URINE: NEGATIVE
GLUCOSE, UA: NEGATIVE mg/dL
Hgb urine dipstick: NEGATIVE
KETONES UR: NEGATIVE mg/dL
Nitrite: POSITIVE — AB
Protein, ur: NEGATIVE mg/dL
Specific Gravity, Urine: >=1.03 (ref 1.005–1.030)
Urobilinogen, UA: 0.2 mg/dL (ref 0.0–1.0)
pH: 5.5 (ref 5.0–8.0)

## 2014-06-03 MED ORDER — NITROFURANTOIN MONOHYD MACRO 100 MG PO CAPS: 100.0000 mg | ORAL_CAPSULE | Freq: Two times a day (BID) | ORAL | Status: DC

## 2014-06-03 NOTE — Progress Notes (Signed)
C/o urinary frequency and feeling like she never emptied all the way; along with bad smelling urine.  Discussed with her urinalysis shows UTI- will prescribe macrobid, send urine for culture and if culture shows another medicine is better- will call her. Reviewed her first ob appointment is 1 week on 06/10/14.

## 2014-06-06 LAB — CULTURE, OB URINE

## 2014-06-10 ENCOUNTER — Ambulatory Visit (HOSPITAL_COMMUNITY)
Admission: RE | Admit: 2014-06-10 | Discharge: 2014-06-10 | Disposition: A | Discharge status: Home / Self Care | Payer: Self-pay | Source: Ambulatory Visit | Attending: Obstetrics and Gynecology | Admitting: Obstetrics and Gynecology

## 2014-06-10 ENCOUNTER — Ambulatory Visit (INDEPENDENT_AMBULATORY_CARE_PROVIDER_SITE_OTHER): Payer: Self-pay | Admitting: Obstetrics and Gynecology

## 2014-06-10 ENCOUNTER — Encounter: Payer: Self-pay | Admitting: Obstetrics and Gynecology

## 2014-06-10 ENCOUNTER — Other Ambulatory Visit: Payer: Self-pay | Admitting: Obstetrics and Gynecology

## 2014-06-10 VITALS — BP 139/86 | HR 109 | Temp 99.2°F | Wt 197.0 lb

## 2014-06-10 DIAGNOSIS — O039 Complete or unspecified spontaneous abortion without complication: Secondary | ICD-10-CM

## 2014-06-10 DIAGNOSIS — Z3480 Encounter for supervision of other normal pregnancy, unspecified trimester: Secondary | ICD-10-CM

## 2014-06-10 DIAGNOSIS — Z3689 Encounter for other specified antenatal screening: Secondary | ICD-10-CM | POA: Insufficient documentation

## 2014-06-10 DIAGNOSIS — O3680X0 Pregnancy with inconclusive fetal viability, not applicable or unspecified: Secondary | ICD-10-CM

## 2014-06-10 DIAGNOSIS — N390 Urinary tract infection, site not specified: Secondary | ICD-10-CM

## 2014-06-10 DIAGNOSIS — Z3491 Encounter for supervision of normal pregnancy, unspecified, first trimester: Secondary | ICD-10-CM

## 2014-06-10 DIAGNOSIS — O208 Other hemorrhage in early pregnancy: Secondary | ICD-10-CM | POA: Insufficient documentation

## 2014-06-10 LAB — POCT URINALYSIS DIP (DEVICE)
Bilirubin Urine: NEGATIVE
Glucose, UA: NEGATIVE mg/dL
Ketones, ur: NEGATIVE mg/dL
LEUKOCYTES UA: NEGATIVE
NITRITE: NEGATIVE
Protein, ur: NEGATIVE mg/dL
Specific Gravity, Urine: >=1.03 (ref 1.005–1.030)
Urobilinogen, UA: 0.2 mg/dL (ref 0.0–1.0)
pH: 5.5 (ref 5.0–8.0)

## 2014-06-10 LAB — OB RESULTS CONSOLE GC/CHLAMYDIA
Chlamydia: NEGATIVE
Gonorrhea: NEGATIVE

## 2014-06-10 NOTE — Progress Notes (Signed)
Demise 9.3 weeks confirmed by US, patient wants expectant management, will f/u in clinic in one week.

## 2014-06-10 NOTE — Progress Notes (Signed)
Edema trace in feet. Education packet given. Early glucola test- done.

## 2014-06-10 NOTE — Patient Instructions (Signed)
Fetal Movement Counts °Patient Name: __________________________________________________ Patient Due Date: ____________________ °Performing a fetal movement count is highly recommended in high-risk pregnancies, but it is good for every pregnant woman to do. Your caregiver may ask you to start counting fetal movements at 28 weeks of the pregnancy. Fetal movements often increase: °· After eating a full meal. °· After physical activity. °· After eating or drinking something sweet or cold. °· At rest. °Pay attention to when you feel the baby is most active. This will help you notice a pattern of your baby's sleep and wake cycles and what factors contribute to an increase in fetal movement. It is important to perform a fetal movement count at the same time each day when your baby is normally most active.  °HOW TO COUNT FETAL MOVEMENTS °1. Find a quiet and comfortable area to sit or lie down on your left side. Lying on your left side provides the best blood and oxygen circulation to your baby. °2. Write down the day and time on a sheet of paper or in a journal. °3. Start counting kicks, flutters, swishes, rolls, or jabs in a 2 hour period. You should feel at least 10 movements within 2 hours. °4. If you do not feel 10 movements in 2 hours, wait 2-3 hours and count again. Look for a change in the pattern or not enough counts in 2 hours. °SEEK MEDICAL CARE IF: °· You feel less than 10 counts in 2 hours, tried twice. °· There is no movement in over an hour. °· The pattern is changing or taking longer each day to reach 10 counts in 2 hours. °· You feel the baby is not moving as he or she usually does. °Date: ____________ Movements: ____________ Start time: ____________ Finish time: ____________  °Date: ____________ Movements: ____________ Start time: ____________ Finish time: ____________ °Date: ____________ Movements: ____________ Start time: ____________ Finish time: ____________ °Date: ____________ Movements: ____________  Start time: ____________ Finish time: ____________ °Date: ____________ Movements: ____________ Start time: ____________ Finish time: ____________ °Date: ____________ Movements: ____________ Start time: ____________ Finish time: ____________ °Date: ____________ Movements: ____________ Start time: ____________ Finish time: ____________ °Date: ____________ Movements: ____________ Start time: ____________ Finish time: ____________  °Date: ____________ Movements: ____________ Start time: ____________ Finish time: ____________ °Date: ____________ Movements: ____________ Start time: ____________ Finish time: ____________ °Date: ____________ Movements: ____________ Start time: ____________ Finish time: ____________ °Date: ____________ Movements: ____________ Start time: ____________ Finish time: ____________ °Date: ____________ Movements: ____________ Start time: ____________ Finish time: ____________ °Date: ____________ Movements: ____________ Start time: ____________ Finish time: ____________ °Date: ____________ Movements: ____________ Start time: ____________ Finish time: ____________  °Date: ____________ Movements: ____________ Start time: ____________ Finish time: ____________ °Date: ____________ Movements: ____________ Start time: ____________ Finish time: ____________ °Date: ____________ Movements: ____________ Start time: ____________ Finish time: ____________ °Date: ____________ Movements: ____________ Start time: ____________ Finish time: ____________ °Date: ____________ Movements: ____________ Start time: ____________ Finish time: ____________ °Date: ____________ Movements: ____________ Start time: ____________ Finish time: ____________ °Date: ____________ Movements: ____________ Start time: ____________ Finish time: ____________  °Date: ____________ Movements: ____________ Start time: ____________ Finish time: ____________ °Date: ____________ Movements: ____________ Start time: ____________ Finish time:  ____________ °Date: ____________ Movements: ____________ Start time: ____________ Finish time: ____________ °Date: ____________ Movements: ____________ Start time: ____________ Finish time: ____________ °Date: ____________ Movements: ____________ Start time: ____________ Finish time: ____________ °Date: ____________ Movements: ____________ Start time: ____________ Finish time: ____________ °Date: ____________ Movements: ____________ Start time: ____________ Finish time: ____________  °Date: ____________ Movements: ____________ Start time: ____________ Finish time:   ____________ °Date: ____________ Movements: ____________ Start time: ____________ Finish time: ____________ °Date: ____________ Movements: ____________ Start time: ____________ Finish time: ____________ °Date: ____________ Movements: ____________ Start time: ____________ Finish time: ____________ °Date: ____________ Movements: ____________ Start time: ____________ Finish time: ____________ °Date: ____________ Movements: ____________ Start time: ____________ Finish time: ____________ °Date: ____________ Movements: ____________ Start time: ____________ Finish time: ____________  °Date: ____________ Movements: ____________ Start time: ____________ Finish time: ____________ °Date: ____________ Movements: ____________ Start time: ____________ Finish time: ____________ °Date: ____________ Movements: ____________ Start time: ____________ Finish time: ____________ °Date: ____________ Movements: ____________ Start time: ____________ Finish time: ____________ °Date: ____________ Movements: ____________ Start time: ____________ Finish time: ____________ °Date: ____________ Movements: ____________ Start time: ____________ Finish time: ____________ °Date: ____________ Movements: ____________ Start time: ____________ Finish time: ____________  °Date: ____________ Movements: ____________ Start time: ____________ Finish time: ____________ °Date: ____________ Movements:  ____________ Start time: ____________ Finish time: ____________ °Date: ____________ Movements: ____________ Start time: ____________ Finish time: ____________ °Date: ____________ Movements: ____________ Start time: ____________ Finish time: ____________ °Date: ____________ Movements: ____________ Start time: ____________ Finish time: ____________ °Date: ____________ Movements: ____________ Start time: ____________ Finish time: ____________ °Date: ____________ Movements: ____________ Start time: ____________ Finish time: ____________  °Date: ____________ Movements: ____________ Start time: ____________ Finish time: ____________ °Date: ____________ Movements: ____________ Start time: ____________ Finish time: ____________ °Date: ____________ Movements: ____________ Start time: ____________ Finish time: ____________ °Date: ____________ Movements: ____________ Start time: ____________ Finish time: ____________ °Date: ____________ Movements: ____________ Start time: ____________ Finish time: ____________ °Date: ____________ Movements: ____________ Start time: ____________ Finish time: ____________ °Document Released: 01/10/2007 Document Revised: 11/27/2012 Document Reviewed: 10/07/2012 °[employer]® Patient Information ©2015 [employer]. This information is not intended to replace advice given to you by your health care provider. Make sure you discuss any questions you have with your health care provider. ° °

## 2014-06-10 NOTE — Progress Notes (Signed)
Bedside US for viability - IUP visualized, no cardiac activity or FM visualized.  Pt to have US @ radiology to confirm demise.

## 2014-06-10 NOTE — Progress Notes (Signed)
No DT FH> to Diane> embryo seen, no cardiac acticvity seen> to [place]

## 2014-06-11 ENCOUNTER — Telehealth: Payer: Self-pay | Admitting: *Deleted

## 2014-06-11 DIAGNOSIS — O039 Complete or unspecified spontaneous abortion without complication: Secondary | ICD-10-CM | POA: Insufficient documentation

## 2014-06-11 LAB — OBSTETRIC PANEL
Antibody Screen: NEGATIVE
Basophils Absolute: 0 10*3/uL (ref 0.0–0.1)
Basophils Relative: 0 % (ref 0–1)
EOS ABS: 0.1 10*3/uL (ref 0.0–0.7)
Eosinophils Relative: 1 % (ref 0–5)
HCT: 35.7 % — ABNORMAL LOW (ref 36.0–46.0)
HEP B S AG: NEGATIVE
Hemoglobin: 12.2 g/dL (ref 12.0–15.0)
Lymphocytes Relative: 36 % (ref 12–46)
Lymphs Abs: 2.1 10*3/uL (ref 0.7–4.0)
MCH: 31.4 pg (ref 26.0–34.0)
MCHC: 34.2 g/dL (ref 30.0–36.0)
MCV: 92 fL (ref 78.0–100.0)
MONOS PCT: 7 % (ref 3–12)
Monocytes Absolute: 0.4 10*3/uL (ref 0.1–1.0)
NEUTROS ABS: 3.3 10*3/uL (ref 1.7–7.7)
NEUTROS PCT: 56 % (ref 43–77)
PLATELETS: 260 10*3/uL (ref 150–400)
RBC: 3.88 MIL/uL (ref 3.87–5.11)
RDW: 13.7 % (ref 11.5–15.5)
RH TYPE: POSITIVE
Rubella: 18.1 Index — ABNORMAL HIGH (ref <0.90)
WBC: 5.9 10*3/uL (ref 4.0–10.5)

## 2014-06-11 LAB — HIV ANTIBODY (ROUTINE TESTING W REFLEX): HIV 1&2 Ab, 4th Generation: NONREACTIVE

## 2014-06-11 LAB — GLUCOSE TOLERANCE, 1 HOUR (50G) W/O FASTING: Glucose, 1 Hour GTT: 130 mg/dL (ref 70–140)

## 2014-06-11 NOTE — Telephone Encounter (Signed)
Opened in error

## 2014-06-11 NOTE — Telephone Encounter (Deleted)
Message copied by Mannie StabileASH, Jabaree Mercado A on Thu Jun 11, 2014  9:10 AM ------      Message from: Nicholaus BloomVE, MYRA C      Created: Thu Jun 11, 2014  8:41 AM       Please prescribe macrobid for 10 days.      Thanks ------

## 2014-06-12 LAB — HEMOGLOBINOPATHY EVALUATION
HGB A2 QUANT: 2.7 % (ref 2.2–3.2)
HGB A: 97.3 % (ref 96.8–97.8)
HGB F QUANT: 0 % (ref 0.0–2.0)
Hemoglobin Other: 0 %
Hgb S Quant: 0 %

## 2014-06-12 LAB — CYTOLOGY - PAP

## 2014-06-13 LAB — CULTURE, OB URINE

## 2014-06-15 ENCOUNTER — Ambulatory Visit (INDEPENDENT_AMBULATORY_CARE_PROVIDER_SITE_OTHER): Payer: Self-pay | Admitting: Obstetrics and Gynecology

## 2014-06-15 VITALS — BP 130/80 | HR 100 | Temp 97.3°F | Wt 199.3 lb

## 2014-06-15 DIAGNOSIS — O039 Complete or unspecified spontaneous abortion without complication: Secondary | ICD-10-CM

## 2014-06-15 LAB — POCT URINALYSIS DIP (DEVICE)
BILIRUBIN URINE: NEGATIVE
Glucose, UA: NEGATIVE mg/dL
Ketones, ur: NEGATIVE mg/dL
Nitrite: POSITIVE — AB
PH: 6 (ref 5.0–8.0)
Protein, ur: NEGATIVE mg/dL
Urobilinogen, UA: 0.2 mg/dL (ref 0.0–1.0)

## 2014-06-15 NOTE — Progress Notes (Signed)
Genetic Counseling scheduled with MFM 06/16/14 at 10 am.

## 2014-06-15 NOTE — Progress Notes (Signed)
Patient diagnosed with a missed Ab on 06/10/2014 and chose expectant management. Over the past week, she has not experienced any cramping or vaginal bleeding. Patient desires to continue expectant management. She also desires genetic testing. This is her second miscarriage with the same partner. She has a daughter from a previous relationship. Will refer to genetic counseling. RTC in 2 weeks in GYN clinic

## 2014-06-16 ENCOUNTER — Ambulatory Visit (HOSPITAL_COMMUNITY)
Admission: RE | Admit: 2014-06-16 | Discharge: 2014-06-16 | Disposition: A | Discharge status: Home / Self Care | Payer: Self-pay | Source: Ambulatory Visit | Attending: Obstetrics and Gynecology | Admitting: Obstetrics and Gynecology

## 2014-06-16 VITALS — BP 134/87 | HR 63 | Wt 199.2 lb

## 2014-06-16 DIAGNOSIS — O2621 Pregnancy care for patient with recurrent pregnancy loss, first trimester: Secondary | ICD-10-CM

## 2014-06-16 NOTE — Consult Note (Signed)
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  She is currently being followed for expectant management and denies any vaginal bleeding or cramping.  The patient's past OB history is remarkable for a term SVD without complications in 2010, a 7 week loss in 2013 that required a D&C and an early loss in 2014 ("blighted ovum") and the recent diagnosis of an embryonic demise.  She is without complaints today.  OB History: OB History   Grav Para Term Preterm Abortions TAB SAB Ect Mult Living   3 1 1       1       PMH:  Past Medical History  Diagnosis Date  . PCOS (polycystic ovarian syndrome)   . Headache(784.0)     PSH:  Past Surgical History  Procedure Laterality Date  . Tonsillectomy     Meds:  Current Outpatient Prescriptions on File Prior to Encounter  Medication Sig Dispense Refill  . acetaminophen (TYLENOL) 500 MG tablet Take 1,000 mg by mouth daily as needed for mild pain or moderate pain.       Marland Kitchen. ibuprofen (ADVIL,MOTRIN) 600 MG tablet Take 1 tablet (600 mg total) by mouth every 6 (six) hours as needed.  30 tablet  1  . Prenatal Vit-Fe Fumarate-FA (PRENATAL MULTIVITAMIN) TABS tablet Take 1 tablet by mouth daily at 12 noon.      . promethazine (PHENERGAN) 25 MG tablet Take 1 tablet (25 mg total) by mouth every 6 (six) hours as needed for nausea or vomiting.  30 tablet  0   No current facility-administered medications on file prior to encounter.   Allergies: No Known Allergies  FH:  Family History  Problem Relation Age of Onset  . Hypertension Father   . Hearing loss Neg Hx   . Hyperlipidemia Mother   . Diabetes Mother   See separate note from Runner, broadcasting/film/videoGenetics counselor.  Soc:   History   Social History  . Marital Status: Significant Other    Spouse Name: N/A    Number of Children: N/A  . Years of Education: N/A   Occupational History  . Not on file.   Social History Main Topics  . Smoking status: Never Smoker   . Smokeless tobacco: Never Used  . Alcohol Use: No  . Drug Use: No  . Sexual Activity: Yes    Birth Control/ Protection: None   Other Topics Concern  . Not on file   Social History Narrative  . No narrative on file    Review of Systems: no vaginal bleeding or cramping/contractions, no LOF, no nausea/vomiting. All other systems reviewed and are negative.   PE:  VS: BP  134/87                  pulse  63                 Weight 199#   A/P: 1) Embryonic demise at ~ 12 weeks by dates - the patient elected to be followed expectantly and has follow up scheduled with Dr. Jolayne Pantheronstant later this week.         2) Recurrent pregnancy loss - we had a long discussion regarding the work up and etiology of recurrent pregnancy loss.  In general,  10-15% of clinically recognized pregnancies under 20 weeks result in miscarriage.  In prospective studies, the risk of miscarriage is 25-46% after 3 consecutive losses, but with the fact that the patient had a previous live birth gives a much more favorable prognosis.  The overall live birth rate in recurrent pregnancy loss is 60-80% - and may actually be higher given the history of a previous liveborn child.  After counseling, the patient has elected to have a karyotype drawn on her husband - and she wishes to proceed in a stepwise fashion thereafter due to expenses.  If the patient elects to have an additional work up, would recommend checking lupus anticoagulant and anticardiolipin antibodies, TSH and a diabetes screen.  If the work up is negative, would also offer an evaluation of the uterine cavity (sonohystogram).   Thank you for the opportunity to be a part of the care of Teresa Olsen. Please contact our  office if we can be of further assistance.   I spent approximately 30 minutes with this patient with over 50% of time spent in face-to-face counseling.  Alpha GulaPaul Whitecar, MD Maternal Fetal Medicine

## 2014-06-19 NOTE — Progress Notes (Signed)
Genetic Counseling  Preconception Note  Appointment Date:  06/16/2014 Referred By: Mora Bellman, MD Date of Birth:  Jan 30, 1991 Partner:  Teresa Olsen   Pregnancy History: [id number]  I met with Ms. Teresa Olsen and her partner, Mr. Teresa Olsen, for genetic counseling because of a history of recurrent pregnancy loss.  We began by reviewing the family history in detail. Ms. Teresa Olsen has a 23 year old daughter from a previous relationship who is healthy. The couple reported that together they have a 7 weeks spontaneous abortion, a 10 week blighted ovum, and the most recent pregnancy was determined to be nonviable at approximately [redacted] weeks gestation. Ms. Teresa Olsen provided Spanish/English interpretation for her partner.   She was counseled that ~15-20% of all clinically recognized pregnancies result in miscarriage and that 80% occur during the first trimester of pregnancy. A single underlying cause is more likely to be suspected when a couple has experienced 2-3 or more losses. It is less likely that there will be an identifiable single underlying cause when a couple has experienced less than 3 losses. We reviewed the multiple etiologies for recurrent pregnancy losses including: maternal disorders (thyroid disease, diabetes, lupus, etc), anatomical differences (incompetent cervix, abnormal uterine position or shape), environmental causes (nutrition, drugs, alcohol, infections), and genetic causes (chromosome differences, inherited clotting disorders).   We reviewed chromosomes and examples of chromosome conditions. We discussed that some chromosome conditions that can lead to early miscarriage are sporadic aneuploidy. However, fetal aneuploidy can also occur when a parent carries a chromosome rearrangement, such as a translocation. We discussed that once a couple has had 2-3 or more unexplained pregnancy losses, the risk for an inherited chromosome difference in one of the  parents (translocation) is ~5-10%. Peripheral chromosome analysis was offered today. Mr. Teresa Olsen elected to proceed with peripheral blood chromosome analysis at the time of today's visit. Ms. Teresa Olsen will consider peripheral blood chromosome analysis pending the results of Mr. Teresa Olsen' testing. Ms. Teresa Olsen also had Maternal Fetal Medicine consultation at the time of today's visit to review the pregnancy history. Please see separate consult note for additional discussion.   The family histories were otherwise found to be contributory for a congenital hole in the heart for the patient's paternal half-brother. He reportedly sees cardiology annually, is 23 years old, and has been told that the hole in the heart should close on its own. He has not required surgical correction. Congenital heart defects occur in approximately 1% of pregnancies.  Congenital heart defects may occur due to multifactorial influences, chromosomal abnormalities, genetic syndromes or environmental exposures.  Isolated heart defects are generally multifactorial.  Given the reported family history and assuming multifactorial inheritance, the risk for a congenital heart defect in a pregnancy for the patient (a third degree relative to the affected individual) does not appear to be increased above the general population risk.  Additionally, Ms. Teresa Olsen reported that her mother was born without vision in her right eye given that the nerve was underdeveloped. She also reportedly has what was described to sound like a port wine birthmark on the right side of her face. The patient's father has congenital vision loss in his left eye. The specific cause is not known. The patient's mother's features could be due to separate etiologies or could possibly fit with a single etiology, such as Sturge-Weber syndrome, which is typically a sporadic condition. Given the reported family history, recurrence risk for similar features  for Ms. Teresa Olsen's offspring would  likely be low.    Mr. Teresa Olsen reported that his sister is a "slow learner." She is 76 years old and in high school. She has not had a formal evaluation to assess her intellectual abilities or assess for an underlying etiology. She is otherwise healthy. There are many different causes of intellectual disabilities including environmental, multifactorial, and genetic etiologies.  We discussed that a specific diagnosis for intellectual disability can be determined in approximately 50% of these individuals.  In the remaining 50% of individuals, a diagnosis may never be determined.  Regarding genetic causes, we discussed that chromosome aberrations (aneuploidy, deletions, duplications, insertions, and translocations) are responsible for a small percentage of individuals with intellectual disability.  Many individuals with chromosome aberrations have additional differences, including congenital anomalies or minor dysmorphisms.  Likewise, single gene conditions are the underlying cause of intellectual delay in some families.  We discussed that many gene conditions have intellectual disability as a feature, but also often include other physical or medical differences. In addition, we discussed the option of this family having an evaluation by a medical geneticist to help determine the cause of the familial intellectual disability. We discussed that without more specific information, it is difficult to provide an accurate risk assessment.  Further genetic counseling is warranted if more information is obtained.  Ms. Teresa Olsen denied exposure to environmental toxins or chemical agents. She denied the use of alcohol, tobacco or street drugs. She denied significant viral illnesses during the course of her pregnancy. Her medical and surgical histories were contributory for polycystic ovarian syndrome, which she reported was diagnosed after her daughter was born.   I  counseled this couple regarding the above risks and available options.  The approximate face-to-face time with the genetic counselor was 40 minutes.  Chipper Oman, MS Certified Genetic Counselor 06/19/2014

## 2014-06-21 DIAGNOSIS — N390 Urinary tract infection, site not specified: Secondary | ICD-10-CM | POA: Insufficient documentation

## 2014-06-23 ENCOUNTER — Telehealth: Payer: Self-pay | Admitting: General Practice

## 2014-06-23 ENCOUNTER — Encounter: Payer: Self-pay | Admitting: General Practice

## 2014-06-23 DIAGNOSIS — N390 Urinary tract infection, site not specified: Secondary | ICD-10-CM

## 2014-06-23 MED ORDER — CEPHALEXIN 500 MG PO CAPS: 500.0000 mg | ORAL_CAPSULE | Freq: Four times a day (QID) | ORAL | Status: DC

## 2014-06-23 NOTE — Telephone Encounter (Signed)
Called patient and informed her of UTI and medication available for pickup. Patient verbalized understanding and I reminded her of her appt on 7/6. Patient verbalized understanding to all and had no other questions

## 2014-06-23 NOTE — Telephone Encounter (Signed)
Message copied by Kathee DeltonHILLMAN, CARRIE L on Tue Jun 23, 2014  9:23 AM ------      Message from: Danae OrleansPOE, DEIRDRE C      Created: Sun Jun 21, 2014 10:39 AM       Keflex 500 qid x 7d 28, NR ------

## 2014-06-25 ENCOUNTER — Telehealth (HOSPITAL_COMMUNITY): Payer: Self-pay | Admitting: MS"

## 2014-06-25 NOTE — Telephone Encounter (Signed)
Left message for patient to call back regarding information for herself and for her partner.   Clydie BraunKaren Nitza Schmid 06/25/2014 2:36 PM

## 2014-06-25 NOTE — Telephone Encounter (Signed)
Ms. Teresa Olsen returned call. Explained to Ms. Teresa Olsen that I was calling with results of peripheral chromosome analysis for her partner, Mr. Teresa Olsen. Per previous discussion, contacted patient with his results. She identified him by name and DOB. We discussed that his karyotype indicates normal female chromosomes 47(46, XY). Reviewed that Ms. Teresa Olsen has option of peripheral chromosome analysis for herself, given history of pregnancy losses for the couple. She plans to discuss this result with Teresa Olsen and call Teresa Olsen back if she would like to pursue chromosome analysis.   Clydie BraunKaren Jeferson Boozer 06/25/2014 2:50 PM

## 2014-06-29 ENCOUNTER — Ambulatory Visit (INDEPENDENT_AMBULATORY_CARE_PROVIDER_SITE_OTHER): Payer: Self-pay | Admitting: Family Medicine

## 2014-06-29 VITALS — BP 119/78 | HR 62 | Temp 97.7°F | Wt 194.5 lb

## 2014-06-29 DIAGNOSIS — Z3480 Encounter for supervision of other normal pregnancy, unspecified trimester: Secondary | ICD-10-CM

## 2014-06-29 DIAGNOSIS — O021 Missed abortion: Secondary | ICD-10-CM

## 2014-06-29 DIAGNOSIS — N96 Recurrent pregnancy loss: Secondary | ICD-10-CM

## 2014-06-29 NOTE — Patient Instructions (Signed)
Miscarriage A miscarriage is the sudden loss of an unborn baby (fetus) before the 20th week of pregnancy. Most miscarriages happen in the first 3 months of pregnancy. Sometimes, it happens before a woman even knows she is pregnant. A miscarriage is also called a "spontaneous miscarriage" or "early pregnancy loss." Having a miscarriage can be an emotional experience. Talk with your caregiver about any questions you may have about miscarrying, the grieving process, and your future pregnancy plans. CAUSES   Problems with the fetal chromosomes that make it impossible for the baby to develop normally. Problems with the baby's genes or chromosomes are most often the result of errors that occur, by chance, as the embryo divides and grows. The problems are not inherited from the parents.  Infection of the cervix or uterus.   Hormone problems.   Problems with the cervix, such as having an incompetent cervix. This is when the tissue in the cervix is not strong enough to hold the pregnancy.   Problems with the uterus, such as an abnormally shaped uterus, uterine fibroids, or congenital abnormalities.   Certain medical conditions.   Smoking, drinking alcohol, or taking illegal drugs.   Trauma.  Often, the cause of a miscarriage is unknown.  SYMPTOMS   Vaginal bleeding or spotting, with or without cramps or pain.  Pain or cramping in the abdomen or lower back.  Passing fluid, tissue, or blood clots from the vagina. DIAGNOSIS  Your caregiver will perform a physical exam. You may also have an ultrasound to confirm the miscarriage. Blood or urine tests may also be ordered. TREATMENT   Sometimes, treatment is not necessary if you naturally pass all the fetal tissue that was in the uterus. If some of the fetus or placenta remains in the body (incomplete miscarriage), tissue left behind may become infected and must be removed. Usually, a dilation and curettage (D and C) procedure is performed.  During a D and C procedure, the cervix is widened (dilated) and any remaining fetal or placental tissue is gently removed from the uterus.  Antibiotic medicines are prescribed if there is an infection. Other medicines may be given to reduce the size of the uterus (contract) if there is a lot of bleeding.  If you have Rh negative blood and your baby was Rh positive, you will need a Rh immunoglobulin shot. This shot will protect any future baby from having Rh blood problems in future pregnancies. HOME CARE INSTRUCTIONS   Your caregiver may order bed rest or may allow you to continue light activity. Resume activity as directed by your caregiver.  Have someone help with home and family responsibilities during this time.   Keep track of the number of sanitary pads you use each day and how soaked (saturated) they are. Write down this information.   Do not use tampons. Do not douche or have sexual intercourse until approved by your caregiver.   Only take over-the-counter or prescription medicines for pain or discomfort as directed by your caregiver.   Do not take aspirin. Aspirin can cause bleeding.   Keep all follow-up appointments with your caregiver.   If you or your partner have problems with grieving, talk to your caregiver or seek counseling to help cope with the pregnancy loss. Allow enough time to grieve before trying to get pregnant again.  SEEK IMMEDIATE MEDICAL CARE IF:   You have severe cramps or pain in your back or abdomen.  You have a fever.  You pass large blood clots (walnut-sized   or larger) ortissue from your vagina. Save any tissue for your caregiver to inspect.   Your bleeding increases.   You have a thick, bad-smelling vaginal discharge.  You become lightheaded, weak, or you faint.   You have chills.  MAKE SURE YOU:  Understand these instructions.  Will watch your condition.  Will get help right away if you are not doing well or get  worse. Document Released: 06/06/2001 Document Revised: 04/07/2013 Document Reviewed: 01/30/2012 ExitCare Patient Information 2015 [employer]. This information is not intended to replace advice given to you by your health care provider. Make sure you discuss any questions you have with your health care provider.  

## 2014-06-29 NOTE — Progress Notes (Signed)
Subjective:     Patient ID: Kem Boroughs, female   DOB: May 24, 1991, 23 y.o.   MRN: [id number]  HPI Dianah Pruett is a 23 y.o. [phone] with miscarriage at [facility]. Pt had a failed preganncy and reports passing of tissue. This is her third miscarriage and she would like additional testing for why she continues to miscarry. Pt has met with genetic counseling.   Recently found to have galstones in ED in [place] yesterday.    Review of Systems No vaginal bleeding, no pain, no complaints today    Objective:   Physical Exam Filed Vitals:   06/29/14 1359  BP: 119/78  Pulse: 62  Temp: 97.7 F (36.5 C)   VSS NAD RRR no mgt CTAB no wrc RUQ Tendernes simproved from yesterday No suprapubic or uterin pain     Assessment:     Jaleiyah Alas is a 23 y.o. [phone] with recurrent miscarriage - BhCG ensure appropriatly dropped - tsh, anticadriolipin ab, and Lupus anti. - dec [place] today give hx of normal pregnancy and first miscarriage prior to D&E however may be considered at future pregnancy. No report of abnormality on TVUS, recommend HSG for better evaluation.   Fredrik Rigger, MD OB Fellow

## 2014-06-29 NOTE — Progress Notes (Signed)
Here for follow up of miscarriage. States went to ER in [place] 06/28/14 for epigastric pain. They told her she has gallstones. Today c/o pain in epigastirc=5

## 2014-06-30 LAB — HCG, QUANTITATIVE, PREGNANCY: hCG, Beta Chain, Quant, S: 23.7 m[IU]/mL

## 2014-06-30 LAB — TSH: TSH: 1.077 u[IU]/mL (ref 0.350–4.500)

## 2014-07-07 ENCOUNTER — Telehealth: Payer: Self-pay | Admitting: *Deleted

## 2014-07-07 NOTE — Telephone Encounter (Signed)
Pt left message requesting results form labs. Called pt and informed her of normal TSH and that the HCG level has greatly reduced indicating a probable complete miscarriage. We need to follow the level down to 5 or less so it is recommended that she have one more HCG level drawn. Pt voiced understanding and agreed to lab draw appt on 7/20 @ 0900.

## 2014-07-07 NOTE — Progress Notes (Signed)
Error

## 2014-07-13 ENCOUNTER — Other Ambulatory Visit: Payer: Self-pay

## 2014-07-13 DIAGNOSIS — N939 Abnormal uterine and vaginal bleeding, unspecified: Secondary | ICD-10-CM

## 2014-07-13 LAB — HCG, QUANTITATIVE, PREGNANCY

## 2014-07-15 ENCOUNTER — Telehealth: Payer: Self-pay

## 2014-07-15 NOTE — Telephone Encounter (Signed)
Called patient and informed her of results. Patient wishes to set up an appointment with provider to discuss birth control options. Informed patient I would pass her information to front desk so that they may schedule her appointment, however, advised that it will not be until beginning of September. Patient verbalized understanding and gratitude. No questions or concerns.

## 2014-07-15 NOTE — Telephone Encounter (Signed)
Message copied by Louanna RawAMPBELL, Brando Taves M on Wed Jul 15, 2014 11:56 AM ------      Message from: Jaynie CollinsANYANWU, UGONNA A      Created: Wed Jul 15, 2014 10:34 AM       Complete miscarriage. No further HCG draws needed. Patient can return for evaluation of recurrent miscarriages, with any provider. Please call to inform patient of results and recommendations.       ------

## 2014-07-20 ENCOUNTER — Telehealth: Payer: Self-pay | Admitting: General Practice

## 2014-07-20 NOTE — Telephone Encounter (Signed)
Patient called and left message stating the last time she was here she was told her hcg levels were down and at 2 and that she had had a complete miscarriage but she is still spotting and she has taken three pregnancy tests recently and they are all still positive. Called patient back and she states she has had unprotected sex recently. Asked patient if she could come in tomorrow for hcg check. Patient verbalized understanding and stated she could be here at 9. Patient had no other questions

## 2014-07-21 ENCOUNTER — Other Ambulatory Visit: Payer: Self-pay

## 2014-07-21 DIAGNOSIS — N939 Abnormal uterine and vaginal bleeding, unspecified: Secondary | ICD-10-CM

## 2014-07-21 LAB — HCG, QUANTITATIVE, PREGNANCY: hCG, Beta Chain, Quant, S: 2 m[IU]/mL

## 2014-07-23 ENCOUNTER — Telehealth: Payer: Self-pay | Admitting: General Practice

## 2014-07-23 NOTE — Telephone Encounter (Signed)
Patient called and left message stating she would like her test results. Called patient and informed her of results. Patient verbalized understanding and stated she has been having spotting recently. Told patient it could be her body trying to regulate her periods again. Patient verbalized understanding and asked how many cycles she should have before trying to get pregnant again. Told patient doctors recommend 2-3 months of a normal cycle before trying to get pregnant. Patient verbalized understanding and had no other questions

## 2014-07-30 ENCOUNTER — Emergency Department (HOSPITAL_COMMUNITY)
Admission: EM | Admit: 2014-07-30 | Discharge: 2014-07-30 | Disposition: A | Discharge status: Home / Self Care | Payer: Self-pay | Source: Home / Self Care | Attending: Family Medicine | Admitting: Family Medicine

## 2014-07-30 ENCOUNTER — Encounter (HOSPITAL_COMMUNITY): Payer: Self-pay | Admitting: Emergency Medicine

## 2014-07-30 DIAGNOSIS — B86 Scabies: Secondary | ICD-10-CM

## 2014-07-30 MED ORDER — PERMETHRIN 5 % EX CREA: TOPICAL_CREAM | CUTANEOUS | Status: DC

## 2014-07-30 NOTE — ED Provider Notes (Signed)
CSN: [id number]     Arrival date & time 07/30/14  0957 History   First MD Initiated Contact with Patient 07/30/14 1045     Chief Complaint  Patient presents with  . Rash   (Consider location/radiation/quality/duration/timing/severity/associated sxs/prior Treatment) HPI Comments: Rash started on abd and spread to back and feet, especially in between toes  Patient is a 23 y.o. female presenting with rash. The history is provided by the patient.  Rash Location:  Torso and foot Torso rash location: b abd at waistline. Foot rash location:  L foot and R foot Quality: itchiness and redness   Severity:  Moderate Onset quality:  Sudden Duration:  5 days Timing:  Constant Progression:  Spreading Chronicity:  New Context: not exposure to similar rash   Relieved by:  None tried Worsened by:  Nothing tried Ineffective treatments:  None tried Associated symptoms: no fever     Past Medical History  Diagnosis Date  . PCOS (polycystic ovarian syndrome)   . UXLKGMWN(027.2)    Past Surgical History  Procedure Laterality Date  . Tonsillectomy     Family History  Problem Relation Age of Onset  . Hypertension Father   . Hearing loss Neg Hx   . Hyperlipidemia Mother   . Diabetes Mother    History  Substance Use Topics  . Smoking status: Never Smoker   . Smokeless tobacco: Never Used  . Alcohol Use: No   OB History   Grav Para Term Preterm Abortions TAB SAB Ect Mult Living   3 1 1  0 1 0 1 0 0 1     Review of Systems  Constitutional: Negative for fever and chills.  Skin: Positive for rash.    Allergies  Review of patient's allergies indicates no known allergies.  Home Medications   Prior to Admission medications   Medication Sig Start Date End Date Taking? Authorizing Provider  acetaminophen (TYLENOL) 500 MG tablet Take 1,000 mg by mouth daily as needed for mild pain or moderate pain.     Historical Provider, MD  cephALEXin (KEFLEX) 500 MG capsule Take 1 capsule (500 mg  total) by mouth 4 (four) times daily. 06/23/14   Deirdre C Poe, CNM  ibuprofen (ADVIL,MOTRIN) 600 MG tablet Take 1 tablet (600 mg total) by mouth every 6 (six) hours as needed. 01/31/14   Wilmer Floor Leftwich-Kirby, CNM  permethrin (ELIMITE) 5 % cream Apply to affected area once; leave on for 8-14 hours then wash off. May repeat in one week if needed. 07/30/14   Cathlyn Parsons, NP  Prenatal Vit-Fe Fumarate-FA (PRENATAL MULTIVITAMIN) TABS tablet Take 1 tablet by mouth daily at 12 noon.    Historical Provider, MD  promethazine (PHENERGAN) 25 MG tablet Take 1 tablet (25 mg total) by mouth every 6 (six) hours as needed for nausea or vomiting. 05/29/14   Freddi Starr, PA-C   BP 116/76  Pulse 52  Temp(Src) 97.8 F (36.6 C) (Oral)  Resp 18  SpO2 97%  LMP 03/20/2014 Physical Exam  Constitutional: She appears well-developed and well-nourished. No distress.  Skin: Skin is warm and dry. Rash noted. Rash is papular.  Linear groups of red tiny papules on abd, on back, on B feet and in between toes c/w scabies    ED Course  Procedures (including critical care time) Labs Review Labs Reviewed - No data to display  Imaging Review No results found.   MDM   1. Scabies   rx permethrin 5% cream; apply to body from  neck down; rx qs for 2 adults and 1 child (lives with boyfriend and daughter); may repeat in 1 week x1; no refills given. Pt to wash all of bedding and clothing in hottest water.      Cathlyn ParsonsAngela M Kabbe, NP 07/30/14 1053

## 2014-07-30 NOTE — ED Notes (Addendum)
Pt  Has    A   Red   Rash    On  Feet  abd  And  Other parts  Of    body      That  Itch       With   The           Onset     About  5  Days  Ago          She  denys  Any  New  meds              Or  Any known causative  Agents              No  Angioedema     Sitting  Upright on  Exam table  Speaking in  Complete  sentances

## 2014-07-30 NOTE — Discharge Instructions (Signed)
Wash all of your bedding and clothing in hottest water possible.   Scabies Scabies are small bugs (mites) that burrow under the skin and cause red bumps and severe itching. These bugs can only be seen with a microscope. Scabies are highly contagious. They can spread easily from person to person by direct contact. They are also spread through sharing clothing or linens that have the scabies mites living in them. It is not unusual for an entire family to become infected through shared towels, clothing, or bedding.  HOME CARE INSTRUCTIONS   Your caregiver may prescribe a cream or lotion to kill the mites. If cream is prescribed, massage the cream into the entire body from the neck to the bottom of both feet. Also massage the cream into the scalp and face if your child is less than 23 year old. Avoid the eyes and mouth. Do not wash your hands after application.  Leave the cream on for 8 to 12 hours. Your child should bathe or shower after the 8 to 12 hour application period. Sometimes it is helpful to apply the cream to your child right before bedtime.  One treatment is usually effective and will eliminate approximately 95% of infestations. For severe cases, your caregiver may decide to repeat the treatment in 1 week. Everyone in your household should be treated with one application of the cream.  New rashes or burrows should not appear within 24 to 48 hours after successful treatment. However, the itching and rash may last for 2 to 4 weeks after successful treatment. Your caregiver may prescribe a medicine to help with the itching or to help the rash go away more quickly.  Scabies can live on clothing or linens for up to 3 days. All of your child's recently used clothing, towels, stuffed toys, and bed linens should be washed in hot water and then dried in a dryer for at least 20 minutes on high heat. Items that cannot be washed should be enclosed in a plastic bag for at least 3 days.  To help relieve  itching, bathe your child in a cool bath or apply cool washcloths to the affected areas.  Your child may return to school after treatment with the prescribed cream. SEEK MEDICAL CARE IF:   The itching persists longer than 4 weeks after treatment.  The rash spreads or becomes infected. Signs of infection include red blisters or yellow-tan crust. Document Released: 12/11/2005 Document Revised: 03/04/2012 Document Reviewed: 04/21/2009 [facility] IncExitCare Patient Information 2015 [place], [place]. This information is not intended to replace advice given to you by your health care provider. Make sure you discuss any questions you have with your health care provider.

## 2014-08-07 ENCOUNTER — Encounter: Payer: Self-pay | Admitting: General Practice

## 2014-08-13 ENCOUNTER — Ambulatory Visit: Payer: Self-pay | Admitting: Family Medicine

## 2014-08-14 ENCOUNTER — Encounter: Payer: Self-pay | Admitting: General Practice

## 2014-08-27 NOTE — ED Provider Notes (Signed)
Medical screening examination/treatment/procedure(s) were performed by a resident physician or non-physician practitioner and as the supervising physician I was immediately available for consultation/collaboration.  Nickolai Rinks, MD   Tariq Pernell S Khai Arrona, MD 08/27/14 0747 

## 2014-10-15 ENCOUNTER — Encounter (HOSPITAL_COMMUNITY): Payer: Self-pay | Admitting: Emergency Medicine

## 2014-10-15 ENCOUNTER — Emergency Department (HOSPITAL_COMMUNITY)
Admission: EM | Admit: 2014-10-15 | Discharge: 2014-10-15 | Disposition: A | Discharge status: Home / Self Care | Payer: Self-pay | Attending: Emergency Medicine | Admitting: Emergency Medicine

## 2014-10-15 DIAGNOSIS — K088 Other specified disorders of teeth and supporting structures: Secondary | ICD-10-CM | POA: Insufficient documentation

## 2014-10-15 DIAGNOSIS — Z8639 Personal history of other endocrine, nutritional and metabolic disease: Secondary | ICD-10-CM | POA: Insufficient documentation

## 2014-10-15 DIAGNOSIS — K029 Dental caries, unspecified: Secondary | ICD-10-CM | POA: Insufficient documentation

## 2014-10-15 DIAGNOSIS — R110 Nausea: Secondary | ICD-10-CM | POA: Insufficient documentation

## 2014-10-15 MED ORDER — OXYCODONE-ACETAMINOPHEN 5-325 MG PO TABS
1.0000 | ORAL_TABLET | Freq: Once | ORAL | Status: AC
Start: 2014-10-15 — End: 2014-10-15
  Administered 2014-10-15: 1 via ORAL
  Filled 2014-10-15: qty 1

## 2014-10-15 MED ORDER — PENICILLIN V POTASSIUM 500 MG PO TABS: 500.0000 mg | ORAL_TABLET | Freq: Three times a day (TID) | ORAL | Status: DC

## 2014-10-15 MED ORDER — OXYCODONE-ACETAMINOPHEN 5-325 MG PO TABS: 1.0000 | ORAL_TABLET | Freq: Three times a day (TID) | ORAL | Status: DC | PRN

## 2014-10-15 NOTE — ED Notes (Signed)
Pt c/o toothache x 2 days. Pt has appt with dentist this Monday but couldn't tolerate the pain.

## 2014-10-15 NOTE — Discharge Instructions (Signed)
1. Medications: percocet, penicillin, usual home medications 2. Treatment: rest, drink plenty of fluids, take medications as prescribed 3. Follow Up: Please followup with dentistry at your appointment on Monday morning for discussion of your diagnoses and further evaluation after today's visit; if you do not have a primary care doctor use the resource guide provided to find one; Return to the ER for high fevers, difficulty breathing, difficulty swallowing or other concerning symptoms  Dental Caries Dental caries is tooth decay. This decay can cause a hole in teeth (cavity) that can get bigger and deeper over time. HOME CARE  Brush and floss your teeth. Do this at least two times a day.  Use a fluoride toothpaste.  Use a mouth rinse if told by your dentist or doctor.  Eat less sugary and starchy foods. Drink less sugary drinks.  Avoid snacking often on sugary and starchy foods. Avoid sipping often on sugary drinks.  Keep regular checkups and cleanings with your dentist.  Use fluoride supplements if told by your dentist or doctor.  Allow fluoride to be applied to teeth if told by your dentist or doctor. Document Released: 09/19/2008 Document Revised: 04/27/2014 Document Reviewed: 12/13/2012 [facility]ExitCare Patient Information 2015 [place]ExitCare, [place]LL[place]. This information is not intended to replace advice given to you by your health care provider. Make sure you discuss any questions you have with your health care provider.

## 2014-10-15 NOTE — ED Provider Notes (Signed)
CSN: [id number]     Arrival date & time 10/15/14  2120 History  This chart was scribed for non-physician practitioner Dierdre ForthHannah Shameka Aggarwal, PA-C working with Audree CamelScott T Goldston, MD by Murriel HopperAlec Bankhead, ED Scribe. This patient was seen in room TR07C/TR07C and the patient's care was started at 9:57 PM.    Chief Complaint  Patient presents with  . Dental Pain     (Consider location/radiation/quality/duration/timing/severity/associated sxs/prior Treatment) The history is provided by the patient and medical records. No language interpreter was used.    HPI Comments: Teresa Olsen is a 23 y.o. female who presents to the Emergency Department complaining of left maxillary dental pain associated with nausea and vomiting that has been constant for two days. Pt notes that the tooth has been broken for a long time, and that the tooth turned a dark color earlier today. Pt has not eaten since this morning because of the pain. Pt notes pain with hot foods and liquids and cold foods and liquids. Pt is 4 weeks and 3 days pregnant. Pt denies fever, aches, or chills, and denies abdominal pain and vaginal bleeding. Pt has a dental appointment scheduled in three days to address the issue.     Past Medical History  Diagnosis Date  . PCOS (polycystic ovarian syndrome)   . WUJWJXBJ(478.2Headache(784.0)    Past Surgical History  Procedure Laterality Date  . Tonsillectomy     Family History  Problem Relation Age of Onset  . Hypertension Father   . Hearing loss Neg Hx   . Hyperlipidemia Mother   . Diabetes Mother    History  Substance Use Topics  . Smoking status: Never Smoker   . Smokeless tobacco: Never Used  . Alcohol Use: No   OB History   Grav Para Term Preterm Abortions TAB SAB Ect Mult Living   4 1 1  0 1 0 1 0 0 1     Review of Systems  Constitutional: Negative for fever, chills and appetite change.  HENT: Positive for dental problem. Negative for drooling, ear pain, facial swelling, nosebleeds,  postnasal drip, rhinorrhea and trouble swallowing.   Eyes: Negative for pain and redness.  Respiratory: Negative for cough and wheezing.   Cardiovascular: Negative for chest pain.  Gastrointestinal: Positive for nausea. Negative for vomiting and abdominal pain.  Musculoskeletal: Negative for neck pain and neck stiffness.  Skin: Negative for color change and rash.  Neurological: Negative for weakness, light-headedness and headaches.  All other systems reviewed and are negative.     Allergies  Review of patient's allergies indicates no known allergies.  Home Medications   Prior to Admission medications   Medication Sig Start Date End Date Taking? Authorizing Provider  acetaminophen (TYLENOL) 325 MG tablet Take 650 mg by mouth every 6 (six) hours as needed for moderate pain.    Yes Historical Provider, MD  aspirin-acetaminophen-caffeine (EXCEDRIN MIGRAINE) [id number] MG per tablet Take 2 tablets by mouth every 6 (six) hours as needed for headache.   Yes Historical Provider, MD  oxyCODONE-acetaminophen (PERCOCET) 5-325 MG per tablet Take 1 tablet by mouth every 8 (eight) hours as needed. 10/15/14   Zailynn Brandel, PA-C  penicillin v potassium (VEETID) 500 MG tablet Take 1 tablet (500 mg total) by mouth 3 (three) times daily. 10/15/14   Sarin Comunale, PA-C   BP 120/78  Pulse 99  Temp(Src) 97.2 F (36.2 C) (Oral)  Resp 16  Ht 5\' 5"  (1.651 m)  Wt 185 lb 4.8 oz (84.052 kg)  BMI 30.84  kg/m2  SpO2 99%  LMP 09/11/2014  Breastfeeding? Unknown Physical Exam  Nursing note and vitals reviewed. Constitutional: She appears well-developed and well-nourished.  HENT:  Head: Normocephalic.  Right Ear: Tympanic membrane, external ear and ear canal normal.  Left Ear: Tympanic membrane, external ear and ear canal normal.  Nose: Nose normal. Right sinus exhibits no maxillary sinus tenderness and no frontal sinus tenderness. Left sinus exhibits no maxillary sinus tenderness and no frontal  sinus tenderness.  Mouth/Throat: Uvula is midline, oropharynx is clear and moist and mucous membranes are normal. No oral lesions. Abnormal dentition. Dental caries present. No uvula swelling or lacerations. No oropharyngeal exudate, posterior oropharyngeal edema, posterior oropharyngeal erythema or tonsillar abscesses.    No gingival swelling, fluctuance or induration No gross abscess Tooth #11 is broken and decayed, mild erythema of the surrounding gingiva without induration  Eyes: Conjunctivae are normal. Pupils are equal, round, and reactive to light. Right eye exhibits no discharge. Left eye exhibits no discharge.  Neck: Normal range of motion. Neck supple.  No stridor Handling secretions without difficulty No nuchal rigidity No cervical lymphadenopathy   Cardiovascular: Normal rate, regular rhythm and normal heart sounds.   Pulmonary/Chest: Effort normal. No respiratory distress.  Equal chest rise  Abdominal: Soft. Bowel sounds are normal. She exhibits no distension. There is no tenderness. There is no guarding and no CVA tenderness.  Abdomen soft nontender  Lymphadenopathy:    She has no cervical adenopathy.  Neurological: She is alert.  Skin: Skin is warm and dry.  Psychiatric: She has a normal mood and affect.    ED Course  Procedures (including critical care time)  DIAGNOSTIC STUDIES: Oxygen Saturation is 100% on RA, normal by my interpretation.    COORDINATION OF CARE: 9:59 PM Discussed treatment plan with pt at bedside and pt agreed to plan.   Labs Review Labs Reviewed - No data to display  Imaging Review No results found.   EKG Interpretation None      MDM   Final diagnoses:  Pain due to dental caries   Teresa Olsen presents with dental pain, broken tooth and evidence of mild dental infection.  No gross abscess.  Exam unconcerning for Ludwig's angina or spread of infection.  Will treat with penicillin and pain medicine.  Patient is [redacted] weeks  pregnant. No abdominal pain, vaginal bleeding. She is taking prenatal vitamins as directed. Urged patient to follow-up with dentist at her already scheduled appointment on Monday morning.  I have personally reviewed patient's vitals, nursing note and any pertinent labs or imaging.  I performed an focused physical exam; undressed when appropriate .    It has been determined that no acute conditions requiring further emergency intervention are present at this time. The patient/guardian have been advised of the diagnosis and plan. I reviewed any labs and imaging including any potential incidental findings. We have discussed signs and symptoms that warrant return to the ED and they are listed in the discharge instructions.    Vital signs are stable at discharge.   BP 120/78  Pulse 99  Temp(Src) 97.2 F (36.2 C) (Oral)  Resp 16  Ht 5\' 5"  (1.651 m)  Wt 185 lb 4.8 oz (84.052 kg)  BMI 30.84 kg/m2  SpO2 99%  LMP 09/11/2014  Breastfeeding? Unknown   I personally performed the services described in this documentation, which was scribed in my presence. The recorded information has been reviewed and is accurate.    Dahlia ClientHannah Saara Kijowski, PA-C 10/16/14 0206

## 2014-10-17 NOTE — ED Provider Notes (Signed)
Medical screening examination/treatment/procedure(s) were performed by non-physician practitioner and as supervising physician I was immediately available for consultation/collaboration.   EKG Interpretation None        Kamiah Fite T Danen Lapaglia, MD 10/17/14 1501 

## 2014-10-26 ENCOUNTER — Encounter (HOSPITAL_COMMUNITY): Payer: Self-pay | Admitting: Emergency Medicine

## 2014-11-16 ENCOUNTER — Emergency Department (HOSPITAL_COMMUNITY)
Admission: EM | Admit: 2014-11-16 | Discharge: 2014-11-17 | Disposition: A | Discharge status: Home / Self Care | Payer: Self-pay | Attending: Emergency Medicine | Admitting: Emergency Medicine

## 2014-11-16 ENCOUNTER — Encounter (HOSPITAL_COMMUNITY): Payer: Self-pay | Admitting: *Deleted

## 2014-11-16 DIAGNOSIS — Z3491 Encounter for supervision of normal pregnancy, unspecified, first trimester: Secondary | ICD-10-CM

## 2014-11-16 DIAGNOSIS — Z3A08 8 weeks gestation of pregnancy: Secondary | ICD-10-CM | POA: Insufficient documentation

## 2014-11-16 DIAGNOSIS — Z8639 Personal history of other endocrine, nutritional and metabolic disease: Secondary | ICD-10-CM | POA: Insufficient documentation

## 2014-11-16 DIAGNOSIS — O210 Mild hyperemesis gravidarum: Secondary | ICD-10-CM | POA: Insufficient documentation

## 2014-11-16 DIAGNOSIS — R102 Pelvic and perineal pain: Secondary | ICD-10-CM | POA: Insufficient documentation

## 2014-11-16 DIAGNOSIS — O9989 Other specified diseases and conditions complicating pregnancy, childbirth and the puerperium: Secondary | ICD-10-CM | POA: Insufficient documentation

## 2014-11-16 LAB — URINE MICROSCOPIC-ADD ON

## 2014-11-16 LAB — URINALYSIS, ROUTINE W REFLEX MICROSCOPIC
Bilirubin Urine: NEGATIVE
Glucose, UA: NEGATIVE mg/dL
KETONES UR: NEGATIVE mg/dL
NITRITE: NEGATIVE
Protein, ur: 30 mg/dL — AB
Specific Gravity, Urine: 1.027 (ref 1.005–1.030)
Urobilinogen, UA: 0.2 mg/dL (ref 0.0–1.0)
pH: 6 (ref 5.0–8.0)

## 2014-11-16 LAB — POC URINE PREG, ED: Preg Test, Ur: POSITIVE — AB

## 2014-11-16 NOTE — ED Notes (Signed)
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

## 2014-11-16 NOTE — ED Notes (Signed)
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 Pt sts that she is [redacted] weeks pregnant at this time. Pt has been unable to get an appointment with the women's clinic because they told her that she needed a referral for a high risk pregnancy. Pt attempted to go to health dept for appointment to get a referral but the power was out and they haven't rescheduled. Pt sees a therapist due to stress and depression caused my miscarriages and that therapist told pt to come to ED to see if she could get an ultrasound and referral to women's clinic. Pt also c/o urinary frequency and burning upon urination. Pt denies pain at this time.

## 2014-11-17 ENCOUNTER — Emergency Department (HOSPITAL_COMMUNITY): Payer: Self-pay

## 2014-11-17 LAB — I-STAT BETA HCG BLOOD, ED (MC, WL, AP ONLY): I-stat hCG, quantitative: >2000 m[IU]/mL — ABNORMAL HIGH (ref <5)

## 2014-11-17 MED ORDER — SODIUM CHLORIDE 0.9 % IV BOLUS (SEPSIS)
1000.0000 mL | Freq: Once | INTRAVENOUS | Status: AC
  Administered 2014-11-17: 1000 mL via INTRAVENOUS

## 2014-11-17 MED ORDER — CEPHALEXIN 500 MG PO CAPS: 1000.0000 mg | ORAL_CAPSULE | Freq: Two times a day (BID) | ORAL | Status: DC

## 2014-11-17 MED ORDER — PROMETHAZINE HCL 25 MG PO TABS: 25.0000 mg | ORAL_TABLET | Freq: Three times a day (TID) | ORAL | Status: DC | PRN

## 2014-11-17 NOTE — Discharge Instructions (Signed)
Return here as needed.  Follow-up with the clinic provided.  °

## 2014-11-17 NOTE — ED Provider Notes (Signed)
CSN: [id number]     Arrival date & time 11/16/14  2101 History   First MD Initiated Contact with Patient 11/16/14 2345     Chief Complaint  Patient presents with  . Morning Sickness  . Urinary Frequency     (Consider location/radiation/quality/duration/timing/severity/associated sxs/prior Treatment) HPI Patient presents to the emergency department with complaints of burning with urination and lower pelvic discomfort.  The patient states that this is been ongoing since she found out she was pregnant.  She has also had nausea and vomiting associated with this pregnancy.  Patient denies chest pain, shortness of breath, headache, blurred vision, weakness, dizziness, fever, rash, or syncope.  Patient states that she has had 3 miscarriages in the past year.  Patient states she was due to see the health department but was unable to get an appointment Past Medical History  Diagnosis Date  . PCOS (polycystic ovarian syndrome)   . NWGNFAOZ(308.6Headache(784.0)    Past Surgical History  Procedure Laterality Date  . Tonsillectomy     Family History  Problem Relation Age of Onset  . Hypertension Father   . Hearing loss Neg Hx   . Hyperlipidemia Mother   . Diabetes Mother    History  Substance Use Topics  . Smoking status: Never Smoker   . Smokeless tobacco: Never Used  . Alcohol Use: No   OB History    Gravida Para Term Preterm AB TAB SAB Ectopic Multiple Living   4 1 1  0 1 0 1 0 0 1     Review of Systems   All other systems negative except as documented in the HPI. All pertinent positives and negatives as reviewed in the HPI. Allergies  Review of patient's allergies indicates no known allergies.  Home Medications   Prior to Admission medications   Medication Sig Start Date End Date Taking? Authorizing Provider  acetaminophen (TYLENOL) 325 MG tablet Take 325 mg by mouth every 6 (six) hours as needed for moderate pain.    Yes Historical Provider, MD  meclizine (ANTIVERT) 25 MG tablet Take 50  mg by mouth 3 (three) times daily as needed for dizziness or nausea.   Yes Historical Provider, MD  aspirin-acetaminophen-caffeine (EXCEDRIN MIGRAINE) 4380076299250-250-65 MG per tablet Take 2 tablets by mouth every 6 (six) hours as needed for headache.    Historical Provider, MD  oxyCODONE-acetaminophen (PERCOCET) 5-325 MG per tablet Take 1 tablet by mouth every 8 (eight) hours as needed. Patient not taking: Reported on 11/16/2014 10/15/14   Dahlia ClientHannah Muthersbaugh, PA-C  penicillin v potassium (VEETID) 500 MG tablet Take 1 tablet (500 mg total) by mouth 3 (three) times daily. Patient not taking: Reported on 11/16/2014 10/15/14   Dahlia ClientHannah Muthersbaugh, PA-C   BP 126/78 mmHg  Pulse 76  Temp(Src) 98.4 F (36.9 C) (Oral)  Resp 18  SpO2 100%  LMP 09/11/2014 Physical Exam  Constitutional: She is oriented to person, place, and time. She appears well-developed and well-nourished. No distress.  HENT:  Head: Normocephalic and atraumatic.  Mouth/Throat: Oropharynx is clear and moist.  Eyes: Pupils are equal, round, and reactive to light.  Neck: Normal range of motion. Neck supple.  Cardiovascular: Normal rate, regular rhythm and normal heart sounds.  Exam reveals no gallop and no friction rub.   No murmur heard. Pulmonary/Chest: Effort normal and breath sounds normal. No respiratory distress.  Abdominal: Soft. Bowel sounds are normal. She exhibits no distension. There is tenderness. There is no rebound and no guarding.  Neurological: She is alert and oriented  to person, place, and time.  Skin: Skin is warm and dry. No rash noted. No erythema.  Nursing note and vitals reviewed.   ED Course  Procedures (including critical care time) Labs Review Labs Reviewed  URINALYSIS, ROUTINE W REFLEX MICROSCOPIC - Abnormal; Notable for the following:    APPearance CLOUDY (*)    Hgb urine dipstick TRACE (*)    Protein, ur 30 (*)    Leukocytes, UA MODERATE (*)    All other components within normal limits  URINE  MICROSCOPIC-ADD ON - Abnormal; Notable for the following:    Bacteria, UA FEW (*)    All other components within normal limits  POC URINE PREG, ED - Abnormal; Notable for the following:    Preg Test, Ur POSITIVE (*)    All other components within normal limits  I-STAT BETA HCG BLOOD, ED ([facility], [facility], [facility] ONLY) - Abnormal; Notable for the following:    I-stat hCG, quantitative >2000.0 (*)    All other components within normal limits   Patient will have an ultrasound and [facility]  Patient is advised of the results of all questions were answered.  She is advised to increase her fluid intake, rest as much as possible   MDM   Final diagnoses:  Pelvic pain in female        Carlyle DollyChristopher W Adali Pennings, PA-C 11/18/14 0131  Gwyneth SproutWhitney Plunkett, MD 11/22/14 2255

## 2014-12-10 ENCOUNTER — Encounter (HOSPITAL_COMMUNITY): Payer: Self-pay | Admitting: General Practice

## 2014-12-10 ENCOUNTER — Inpatient Hospital Stay (HOSPITAL_COMMUNITY)
Admission: AD | Admit: 2014-12-10 | Discharge: 2014-12-10 | Disposition: A | Discharge status: Home / Self Care | Payer: Self-pay | Source: Ambulatory Visit | Attending: Obstetrics and Gynecology | Admitting: Obstetrics and Gynecology

## 2014-12-10 DIAGNOSIS — Z3A12 12 weeks gestation of pregnancy: Secondary | ICD-10-CM | POA: Insufficient documentation

## 2014-12-10 DIAGNOSIS — B9789 Other viral agents as the cause of diseases classified elsewhere: Secondary | ICD-10-CM

## 2014-12-10 DIAGNOSIS — O99511 Diseases of the respiratory system complicating pregnancy, first trimester: Secondary | ICD-10-CM | POA: Insufficient documentation

## 2014-12-10 DIAGNOSIS — J069 Acute upper respiratory infection, unspecified: Secondary | ICD-10-CM | POA: Insufficient documentation

## 2014-12-10 LAB — URINALYSIS, ROUTINE W REFLEX MICROSCOPIC
BILIRUBIN URINE: NEGATIVE
Glucose, UA: NEGATIVE mg/dL
Hgb urine dipstick: NEGATIVE
Ketones, ur: NEGATIVE mg/dL
Leukocytes, UA: NEGATIVE
Nitrite: NEGATIVE
PROTEIN: NEGATIVE mg/dL
UROBILINOGEN UA: 0.2 mg/dL (ref 0.0–1.0)
pH: 6 (ref 5.0–8.0)

## 2014-12-10 MED ORDER — PSEUDOEPHEDRINE HCL 30 MG PO TABS
60.0000 mg | ORAL_TABLET | Freq: Once | ORAL | Status: AC
  Administered 2014-12-10: 60 mg via ORAL
  Filled 2014-12-10: qty 2

## 2014-12-10 MED ORDER — ACETAMINOPHEN 325 MG PO TABS
650.0000 mg | ORAL_TABLET | Freq: Once | ORAL | Status: AC
  Administered 2014-12-10: 650 mg via ORAL
  Filled 2014-12-10: qty 2

## 2014-12-10 NOTE — MAU Note (Signed)
Pt reprots she has been having cough and nasal congestion and headache since Tuesday night.  Feel like she might have a fever but has not checked it.

## 2014-12-10 NOTE — Discharge Instructions (Signed)
Second Trimester of Pregnancy The second trimester is from week 13 through week 28, months 4 through 6. The second trimester is often a time when you feel your best. Your body has also adjusted to being pregnant, and you begin to feel better physically. Usually, morning sickness has lessened or quit completely, you may have more energy, and you may have an increase in appetite. The second trimester is also a time when the fetus is growing rapidly. At the end of the sixth month, the fetus is about 9 inches long and weighs about 1 pounds. You will likely begin to feel the baby move (quickening) between 18 and 20 weeks of the pregnancy. BODY CHANGES Your body goes through many changes during pregnancy. The changes vary from woman to woman.   Your weight will continue to increase. You will notice your lower abdomen bulging out.  You may begin to get stretch marks on your hips, abdomen, and breasts.  You may develop headaches that can be relieved by medicines approved by your health care provider.  You may urinate more often because the fetus is pressing on your bladder.  You may develop or continue to have heartburn as a result of your pregnancy.  You may develop constipation because certain hormones are causing the muscles that push waste through your intestines to slow down.  You may develop hemorrhoids or swollen, bulging veins (varicose veins).  You may have back pain because of the weight gain and pregnancy hormones relaxing your joints between the bones in your pelvis and as a result of a shift in weight and the muscles that support your balance.  Your breasts will continue to grow and be tender.  Your gums may bleed and may be sensitive to brushing and flossing.  Dark spots or blotches (chloasma, mask of pregnancy) may develop on your face. This will likely fade after the baby is born.  A dark line from your belly button to the pubic area (linea nigra) may appear. This will likely fade  after the baby is born.  You may have changes in your hair. These can include thickening of your hair, rapid growth, and changes in texture. Some women also have hair loss during or after pregnancy, or hair that feels dry or thin. Your hair will most likely return to normal after your baby is born. WHAT TO EXPECT AT YOUR PRENATAL VISITS During a routine prenatal visit:  You will be weighed to make sure you and the fetus are growing normally.  Your blood pressure will be taken.  Your abdomen will be measured to track your baby's growth.  The fetal heartbeat will be listened to.  Any test results from the previous visit will be discussed. Your health care provider may ask you:  How you are feeling.  If you are feeling the baby move.  If you have had any abnormal symptoms, such as leaking fluid, bleeding, severe headaches, or abdominal cramping.  If you have any questions. Other tests that may be performed during your second trimester include:  Blood tests that check for:  Low iron levels (anemia).  Gestational diabetes (between 24 and 28 weeks).  Rh antibodies.  Urine tests to check for infections, diabetes, or protein in the urine.  An ultrasound to confirm the proper growth and development of the baby.  An amniocentesis to check for possible genetic problems.  Fetal screens for spina bifida and Down syndrome. HOME CARE INSTRUCTIONS   Avoid all smoking, herbs, alcohol, and unprescribed   drugs. These chemicals affect the formation and growth of the baby.  Follow your health care provider's instructions regarding medicine use. There are medicines that are either safe or unsafe to take during pregnancy.  Exercise only as directed by your health care provider. Experiencing uterine cramps is a good sign to stop exercising.  Continue to eat regular, healthy meals.  Wear a good support bra for breast tenderness.  Do not use hot tubs, steam rooms, or saunas.  Wear your  seat belt at all times when driving.  Avoid raw meat, uncooked cheese, cat litter boxes, and soil used by cats. These carry germs that can cause birth defects in the baby.  Take your prenatal vitamins.  Try taking a stool softener (if your health care provider approves) if you develop constipation. Eat more high-fiber foods, such as fresh vegetables or fruit and whole grains. Drink plenty of fluids to keep your urine clear or pale yellow.  Take warm sitz baths to soothe any pain or discomfort caused by hemorrhoids. Use hemorrhoid cream if your health care provider approves.  If you develop varicose veins, wear support hose. Elevate your feet for 15 minutes, 3-4 times a day. Limit salt in your diet.  Avoid heavy lifting, wear low heel shoes, and practice good posture.  Rest with your legs elevated if you have leg cramps or low back pain.  Visit your dentist if you have not gone yet during your pregnancy. Use a soft toothbrush to brush your teeth and be gentle when you floss.  A sexual relationship may be continued unless your health care provider directs you otherwise.  Continue to go to all your prenatal visits as directed by your health care provider. SEEK MEDICAL CARE IF:   You have dizziness.  You have mild pelvic cramps, pelvic pressure, or nagging pain in the abdominal area.  You have persistent nausea, vomiting, or diarrhea.  You have a bad smelling vaginal discharge.  You have pain with urination. SEEK IMMEDIATE MEDICAL CARE IF:   You have a fever.  You are leaking fluid from your vagina.  You have spotting or bleeding from your vagina.  You have severe abdominal cramping or pain.  You have rapid weight gain or loss.  You have shortness of breath with chest pain.  You notice sudden or extreme swelling of your face, hands, ankles, feet, or legs.  You have not felt your baby move in over an hour.  You have severe headaches that do not go away with  medicine.  You have vision changes. Document Released: 12/05/2001 Document Revised: 12/16/2013 Document Reviewed: 02/11/2013 Exit[employer] Patient Information 2015 [employer]. This information is not intended to replace advice given to you by your health care provider. Make sure you discuss any questions you have with your health care provider.  

## 2014-12-10 NOTE — MAU Provider Note (Signed)
History     CSN: [id number]  Arrival date and time: 12/10/14 1801   First Provider Initiated Contact with Patient 12/10/14 2041      Chief Complaint  Patient presents with  . Nasal Congestion   HPI  Teresa Olsen is a 23 y.o. [phone] at 8870w3d who presents today with nasal congestion, headaches, facial pressure and cough. She denies any fever or chills. She states that these symptoms started on Tuesday. She has not taken anything for it at this time because she didn't know what was safe. She has an appointment to start [facility] on [facility] at the [facility].   Past Medical History  Diagnosis Date  . PCOS (polycystic ovarian syndrome)   . JYNWGNFA(213.0Headache(784.0)     Past Surgical History  Procedure Laterality Date  . Tonsillectomy      Family History  Problem Relation Age of Onset  . Hypertension Father   . Hearing loss Neg Hx   . Hyperlipidemia Mother   . Diabetes Mother     History  Substance Use Topics  . Smoking status: Never Smoker   . Smokeless tobacco: Never Used  . Alcohol Use: No    Allergies: No Known Allergies  Prescriptions prior to admission  Medication Sig Dispense Refill Last Dose  . acetaminophen (TYLENOL) 325 MG tablet Take 325 mg by mouth every 6 (six) hours as needed for moderate pain.    Past Month at Unknown time  . aspirin-acetaminophen-caffeine (EXCEDRIN MIGRAINE) 250-250-65 MG per tablet Take 2 tablets by mouth every 6 (six) hours as needed for headache.   Past Month at Unknown time  . DIMENHYDRINATE PO Take 50 mg by mouth 2 (two) times daily as needed (nausea and vomiting).   12/10/2014 at Unknown time  . meclizine (ANTIVERT) 25 MG tablet Take 50 mg by mouth 3 (three) times daily as needed for dizziness or nausea.   Past Month at Unknown time  . promethazine (PHENERGAN) 25 MG tablet Take 1 tablet (25 mg total) by mouth every 8 (eight) hours as needed for nausea or vomiting. 15 tablet 0 Past Month at Unknown time  . cephALEXin (KEFLEX) 500 MG capsule Take  2 capsules (1,000 mg total) by mouth 2 (two) times daily. (Patient not taking: Reported on 12/10/2014) 28 capsule 0 Completed Course at Unknown time  . oxyCODONE-acetaminophen (PERCOCET) 5-325 MG per tablet Take 1 tablet by mouth every 8 (eight) hours as needed. (Patient not taking: Reported on 11/16/2014) 15 tablet 0   . penicillin v potassium (VEETID) 500 MG tablet Take 1 tablet (500 mg total) by mouth 3 (three) times daily. (Patient not taking: Reported on 11/16/2014) 30 tablet 0 Completed Course at Unknown time    ROS Physical Exam   Blood pressure 133/93, pulse 102, temperature 99.2 F (37.3 C), temperature source Oral, resp. rate 18, height 5\' 5"  (1.651 m), weight 83.28 kg (183 lb 9.6 oz), last menstrual period 09/11/2014, SpO2 100 %, unknown if currently breastfeeding.  Physical Exam  Nursing note and vitals reviewed. Constitutional: She is oriented to person, place, and time. She appears well-developed and well-nourished. No distress.  Cardiovascular: Normal rate, regular rhythm and normal heart sounds.   Respiratory: Effort normal and breath sounds normal. No respiratory distress.  GI: Soft. There is no tenderness. There is no rebound.  Neurological: She is alert and oriented to person, place, and time.  Skin: Skin is warm and dry.    MAU Course  Procedures  Results for orders placed or performed during  the hospital encounter of 12/10/14 (from the past 24 hour(s))  Urinalysis, Routine w reflex microscopic     Status: Abnormal   Collection Time: 12/10/14  6:45 PM  Result Value Ref Range   Color, Urine YELLOW YELLOW   APPearance CLEAR CLEAR   Specific Gravity, Urine >1.030 (H) 1.005 - 1.030   pH 6.0 5.0 - 8.0   Glucose, UA NEGATIVE NEGATIVE mg/dL   Hgb urine dipstick NEGATIVE NEGATIVE   Bilirubin Urine NEGATIVE NEGATIVE   Ketones, ur NEGATIVE NEGATIVE mg/dL   Protein, ur NEGATIVE NEGATIVE mg/dL   Urobilinogen, UA 0.2 0.0 - 1.0 mg/dL   Nitrite NEGATIVE NEGATIVE    Leukocytes, UA NEGATIVE NEGATIVE     Assessment and Plan   1. Viral URI with cough    Comfort measures reviewed Safe medications in pregnancy reviewed Return to MAU as needed  Follow-up Information    [facility] [place].   Why:  As scheduled   Contact information:   [address] [facility] [place] [id number] [phone]      Tawnya CrookHogan, Briceyda Abdullah Donovan 12/10/2014, 8:42 PM

## 2014-12-24 LAB — OB RESULTS CONSOLE HIV ANTIBODY (ROUTINE TESTING): HIV: NONREACTIVE

## 2014-12-24 LAB — OB RESULTS CONSOLE RPR: RPR: NONREACTIVE

## 2014-12-25 NOTE — L&D Delivery Note (Signed)
Patient is 24 y.o. [id number] 4813w1d admitted in active labor, GBS neg, uncomplicated hx    Delivery Note At 9:49 AM a viable female was delivered via Vaginal, Spontaneous Delivery (Presentation: ;  ).  APGAR: 9, 10; weight 7 lb 0.5 oz (3189 g).   Placenta status: Intact, Spontaneous.  Cord: 3 vessels with the following complications: None.  Anesthesia: None  Episiotomy: None Lacerations: None Suture Repair: n/a Est. Blood Loss (mL): 568  Mom to postpartum.  Baby to Couplet care / Skin to Skin.  Teresa Olsen ROCIO 06/22/2015, 3:24 PM

## 2015-01-01 ENCOUNTER — Inpatient Hospital Stay (HOSPITAL_COMMUNITY)
Admission: AD | Admit: 2015-01-01 | Discharge: 2015-01-01 | Disposition: A | Discharge status: Home / Self Care | Payer: Medicaid Other | Source: Ambulatory Visit | Attending: Obstetrics & Gynecology | Admitting: Obstetrics & Gynecology

## 2015-01-01 ENCOUNTER — Encounter (HOSPITAL_COMMUNITY): Payer: Self-pay | Admitting: General Practice

## 2015-01-01 DIAGNOSIS — O210 Mild hyperemesis gravidarum: Secondary | ICD-10-CM | POA: Insufficient documentation

## 2015-01-01 DIAGNOSIS — Z3A15 15 weeks gestation of pregnancy: Secondary | ICD-10-CM | POA: Diagnosis not present

## 2015-01-01 DIAGNOSIS — O219 Vomiting of pregnancy, unspecified: Secondary | ICD-10-CM

## 2015-01-01 LAB — URINALYSIS, ROUTINE W REFLEX MICROSCOPIC
Bilirubin Urine: NEGATIVE
Glucose, UA: NEGATIVE mg/dL
HGB URINE DIPSTICK: NEGATIVE
Ketones, ur: 15 mg/dL — AB
Leukocytes, UA: NEGATIVE
NITRITE: NEGATIVE
Protein, ur: NEGATIVE mg/dL
Specific Gravity, Urine: >1.03 — ABNORMAL HIGH (ref 1.005–1.030)
UROBILINOGEN UA: 1 mg/dL (ref 0.0–1.0)
pH: 6 (ref 5.0–8.0)

## 2015-01-01 MED ORDER — ONDANSETRON HCL 4 MG PO TABS: 4.0000 mg | ORAL_TABLET | Freq: Three times a day (TID) | ORAL | Status: DC | PRN

## 2015-01-01 MED ORDER — FAMOTIDINE IN NACL 20-0.9 MG/50ML-% IV SOLN
20.0000 mg | Freq: Once | INTRAVENOUS | Status: AC
  Administered 2015-01-01: 20 mg via INTRAVENOUS
  Filled 2015-01-01: qty 50

## 2015-01-01 MED ORDER — DEXTROSE 5 % IN LACTATED RINGERS IV BOLUS
1000.0000 mL | Freq: Once | INTRAVENOUS | Status: AC
  Administered 2015-01-01: 1000 mL via INTRAVENOUS

## 2015-01-01 MED ORDER — ONDANSETRON HCL 4 MG/2ML IJ SOLN
4.0000 mg | Freq: Once | INTRAMUSCULAR | Status: AC
  Administered 2015-01-01: 4 mg via INTRAVENOUS
  Filled 2015-01-01: qty 2

## 2015-01-01 MED ORDER — PROMETHAZINE HCL 25 MG PO TABS: 12.5000 mg | ORAL_TABLET | Freq: Four times a day (QID) | ORAL | Status: DC | PRN

## 2015-01-01 MED ORDER — METOCLOPRAMIDE HCL 10 MG PO TABS: 10.0000 mg | ORAL_TABLET | Freq: Four times a day (QID) | ORAL | Status: DC

## 2015-01-01 NOTE — MAU Note (Signed)
Pt presents to MAU with complaints of vomiting blood. Denies any vaginal bleeding.

## 2015-01-01 NOTE — Discharge Instructions (Signed)
Nausea medication to take during pregnancy:  ° °Unisom (doxylamine succinate 25 mg tablets) Take one tablet daily at bedtime. If symptoms are not adequately controlled, the dose can be increased to a maximum recommended dose of two tablets daily (1/2 tablet in the morning, 1/2 tablet mid-afternoon and one at bedtime). ° °Vitamin B6 100mg tablets. Take one tablet twice a day (up to 200 mg per day). ° °Add Phenergan as prescribed to take as needed.  ° °Morning Sickness °Morning sickness is when you feel sick to your stomach (nauseous) during pregnancy. This nauseous feeling may or may not come with vomiting. It often occurs in the morning but can be a problem any time of day. Morning sickness is most common during the first trimester, but it may continue throughout pregnancy. While morning sickness is unpleasant, it is usually harmless unless you develop severe and continual vomiting (hyperemesis gravidarum). This condition requires more intense treatment.  °CAUSES  °The cause of morning sickness is not completely known but seems to be related to normal hormonal changes that occur in pregnancy. °RISK FACTORS °You are at greater risk if you: °· Experienced nausea or vomiting before your pregnancy. °· Had morning sickness during a previous pregnancy. °· Are pregnant with more than one baby, such as twins. °TREATMENT  °Do not use any medicines (prescription, over-the-counter, or herbal) for morning sickness without first talking to your health care provider. Your health care provider may prescribe or recommend: °· Vitamin B6 supplements. °· Anti-nausea medicines. °· The herbal medicine ginger. °HOME CARE INSTRUCTIONS  °· Only take over-the-counter or prescription medicines as directed by your health care provider. °· Taking multivitamins before getting pregnant can prevent or decrease the severity of morning sickness in most women. °· Eat a piece of dry toast or unsalted crackers before getting out of bed in the  morning. °· Eat five or six small meals a day. °· Eat dry and bland foods (rice, baked potato). Foods high in carbohydrates are often helpful. °· Do not drink liquids with your meals. Drink liquids between meals. °· Avoid greasy, fatty, and spicy foods. °· Get someone to cook for you if the smell of any food causes nausea and vomiting. °· If you feel nauseous after taking prenatal vitamins, take the vitamins at night or with a snack.  °· Snack on protein foods (nuts, yogurt, cheese) between meals if you are hungry. °· Eat unsweetened gelatins for desserts. °· Wearing an acupressure wristband (worn for sea sickness) may be helpful. °· Acupuncture may be helpful. °· Do not smoke. °· Get a humidifier to keep the air in your house free of odors. °· Get plenty of fresh air. °SEEK MEDICAL CARE IF:  °· Your home remedies are not working, and you need medicine. °· You feel dizzy or lightheaded. °· You are losing weight. °SEEK IMMEDIATE MEDICAL CARE IF:  °· You have persistent and uncontrolled nausea and vomiting. °· You pass out (faint). °MAKE SURE YOU: °· Understand these instructions. °· Will watch your condition. °· Will get help right away if you are not doing well or get worse. °Document Released: 02/01/2007 Document Revised: 12/16/2013 Document Reviewed: 05/28/2013 °[employer]® Patient Information ©2015 [employer]. This information is not intended to replace advice given to you by your health care provider. Make sure you discuss any questions you have with your health care provider. ° °

## 2015-01-01 NOTE — MAU Provider Note (Signed)
Chief Complaint: Emesis and Hematemesis   First Provider Initiated Contact with Patient 01/01/15 [phone]     SUBJECTIVE HPI: Teresa Olsen is a 24 y.o. [id number] at [redacted]w[redacted]d by LMP who presents to maternity admissions reporting n/v in pregnancy with vomiting >9 x in last 24 hours and inability to keep down any food or fluids for almost 24 hours.  She reports if she drinks liquid and then lies down and does not move, she can keep it down, but if she gets up at all, she throws up.  She reports some blood in her emesis today, described as light pink.  She is prescribed phenergan 25 mg Q 6 hours but it is not helping, her last dose was last night.  She also reports trying Unisom/B6 without relief.  She denies abdominal pain vaginal bleeding, vaginal itching/burning, urinary symptoms, h/a, dizziness, or fever/chills.     Past Medical History  Diagnosis Date  . PCOS (polycystic ovarian syndrome)   . GNFAOZHY(865.7)    Past Surgical History  Procedure Laterality Date  . Tonsillectomy     History   Social History  . Marital Status: Significant Other    Spouse Name: N/A    Number of Children: N/A  . Years of Education: N/A   Occupational History  . Not on file.   Social History Main Topics  . Smoking status: Never Smoker   . Smokeless tobacco: Never Used  . Alcohol Use: No  . Drug Use: No  . Sexual Activity: Yes    Birth Control/ Protection: None   Other Topics Concern  . Not on file   Social History Narrative   No current facility-administered medications on file prior to encounter.   Current Outpatient Prescriptions on File Prior to Encounter  Medication Sig Dispense Refill  . cephALEXin (KEFLEX) 500 MG capsule Take 2 capsules (1,000 mg total) by mouth 2 (two) times daily. (Patient not taking: Reported on 12/10/2014) 28 capsule 0  . oxyCODONE-acetaminophen (PERCOCET) 5-325 MG per tablet Take 1 tablet by mouth every 8 (eight) hours as needed. (Patient not taking: Reported on  11/16/2014) 15 tablet 0  . penicillin v potassium (VEETID) 500 MG tablet Take 1 tablet (500 mg total) by mouth 3 (three) times daily. (Patient not taking: Reported on 11/16/2014) 30 tablet 0   No Known Allergies  ROS: Pertinent items in HPI  OBJECTIVE Blood pressure 143/80, pulse 84, temperature 98 F (36.7 C), temperature source Oral, resp. rate 18, height  (1.626 m), weight 80.74 kg (178 lb), last menstrual period 09/11/2014, unknown if currently breastfeeding. GENERAL: Well-developed, well-nourished female in mild distress.  HEENT: Normocephalic HEART: normal rate RESP: normal effort ABDOMEN: Soft, non-tender EXTREMITIES: Nontender, no edema NEURO: Alert and oriented  FHT positive by doppler  LAB RESULTS Results for orders placed or performed during the hospital encounter of 01/01/15 (from the past 24 hour(s))  Urinalysis, Routine w reflex microscopic     Status: Abnormal   Collection Time: 01/01/15  8:50 AM  Result Value Ref Range   Color, Urine YELLOW YELLOW   APPearance CLEAR CLEAR   Specific Gravity, Urine >1.030 (H) 1.005 - 1.030   pH 6.0 5.0 - 8.0   Glucose, UA NEGATIVE NEGATIVE mg/dL   Hgb urine dipstick NEGATIVE NEGATIVE   Bilirubin Urine NEGATIVE NEGATIVE   Ketones, ur 15 (A) NEGATIVE mg/dL   Protein, ur NEGATIVE NEGATIVE mg/dL   Urobilinogen, UA 1.0 0.0 - 1.0 mg/dL   Nitrite NEGATIVE NEGATIVE   Leukocytes, UA  NEGATIVE NEGATIVE    IMAGING No results found.  ASSESSMENT 1. Nausea and vomiting during pregnancy prior to [redacted] weeks gestation     PLAN D5LR x 1000 ml, Zofran 4 mg IV, Pepcid 20 mg IV  Discharge home Zofran and Reglan Rx F/U with prenatal care as scheduled Return to MAU as needed for emergencies    Medication List    STOP taking these medications        cephALEXin 500 MG capsule  Commonly known as:  KEFLEX     oxyCODONE-acetaminophen 5-325 MG per tablet  Commonly known as:  PERCOCET     penicillin v potassium 500 MG tablet   Commonly known as:  VEETID      TAKE these medications        [employer] PRENATAL GUMMY PO  Take 1 tablet by mouth daily.     metoCLOPramide 10 MG tablet  Commonly known as:  REGLAN  Take 1 tablet (10 mg total) by mouth every 6 (six) hours.     ondansetron 4 MG tablet  Commonly known as:  ZOFRAN  Take 1 tablet (4 mg total) by mouth every 8 (eight) hours as needed for nausea or vomiting.     promethazine 25 MG tablet  Commonly known as:  PHENERGAN  Take 0.5-1 tablets (12.5-25 mg total) by mouth every 6 (six) hours as needed for nausea or vomiting.     TENSION HEADACHE PO  Take 1 tablet by mouth daily as needed (headache).       Follow-up Information    Please follow up.   Why:  With prenatal care provider      [facility].   Why:  As needed for emergencies   Contact information:   [address] [id number] mc [place] [phone]      Sharen CounterLisa Leftwich-Kirby Certified Nurse-Midwife 01/01/2015  11:48 AM

## 2015-01-01 NOTE — MAU Note (Signed)
Pt was sent to MAU with c/o continued nausea & vomiting with her pregnancy. States the phenergan is not working.

## 2015-01-21 ENCOUNTER — Other Ambulatory Visit (HOSPITAL_COMMUNITY): Payer: Self-pay | Admitting: Nurse Practitioner

## 2015-01-21 DIAGNOSIS — Z3689 Encounter for other specified antenatal screening: Secondary | ICD-10-CM

## 2015-02-01 ENCOUNTER — Ambulatory Visit (HOSPITAL_COMMUNITY)
Admission: RE | Admit: 2015-02-01 | Discharge: 2015-02-01 | Disposition: A | Discharge status: Home / Self Care | Payer: Medicaid Other | Source: Ambulatory Visit | Attending: Nurse Practitioner | Admitting: Nurse Practitioner

## 2015-02-01 DIAGNOSIS — Z3689 Encounter for other specified antenatal screening: Secondary | ICD-10-CM

## 2015-02-01 DIAGNOSIS — Z36 Encounter for antenatal screening of mother: Secondary | ICD-10-CM | POA: Insufficient documentation

## 2015-02-01 DIAGNOSIS — Z3A20 20 weeks gestation of pregnancy: Secondary | ICD-10-CM | POA: Insufficient documentation

## 2015-02-25 ENCOUNTER — Encounter (HOSPITAL_COMMUNITY): Payer: Self-pay | Admitting: *Deleted

## 2015-05-27 LAB — OB RESULTS CONSOLE GC/CHLAMYDIA
CHLAMYDIA, DNA PROBE: NEGATIVE
GC PROBE AMP, GENITAL: NEGATIVE

## 2015-05-27 LAB — OB RESULTS CONSOLE GBS: GBS: NEGATIVE

## 2015-06-22 ENCOUNTER — Inpatient Hospital Stay (HOSPITAL_COMMUNITY)
Admission: AD | Admit: 2015-06-22 | Discharge: 2015-06-23 | DRG: 775 | Disposition: A | Discharge status: Home / Self Care | Payer: Medicaid Other | Source: Ambulatory Visit | Attending: Family Medicine | Admitting: Family Medicine

## 2015-06-22 ENCOUNTER — Encounter (HOSPITAL_COMMUNITY): Payer: Self-pay | Admitting: *Deleted

## 2015-06-22 DIAGNOSIS — O3483 Maternal care for other abnormalities of pelvic organs, third trimester: Secondary | ICD-10-CM | POA: Diagnosis present

## 2015-06-22 DIAGNOSIS — Z3A40 40 weeks gestation of pregnancy: Secondary | ICD-10-CM | POA: Diagnosis present

## 2015-06-22 DIAGNOSIS — E282 Polycystic ovarian syndrome: Secondary | ICD-10-CM | POA: Diagnosis present

## 2015-06-22 DIAGNOSIS — O480 Post-term pregnancy: Principal | ICD-10-CM | POA: Diagnosis present

## 2015-06-22 DIAGNOSIS — IMO0001 Reserved for inherently not codable concepts without codable children: Secondary | ICD-10-CM

## 2015-06-22 DIAGNOSIS — N858 Other specified noninflammatory disorders of uterus: Secondary | ICD-10-CM | POA: Diagnosis present

## 2015-06-22 LAB — CBC
HEMATOCRIT: 32.6 % — AB (ref 36.0–46.0)
Hemoglobin: 10.9 g/dL — ABNORMAL LOW (ref 12.0–15.0)
MCH: 29.1 pg (ref 26.0–34.0)
MCHC: 33.4 g/dL (ref 30.0–36.0)
MCV: 86.9 fL (ref 78.0–100.0)
Platelets: 219 10*3/uL (ref 150–400)
RBC: 3.75 MIL/uL — AB (ref 3.87–5.11)
RDW: 20.4 % — ABNORMAL HIGH (ref 11.5–15.5)
WBC: 8.3 10*3/uL (ref 4.0–10.5)

## 2015-06-22 LAB — TYPE AND SCREEN
ABO/RH(D): A POS
ANTIBODY SCREEN: NEGATIVE

## 2015-06-22 LAB — RPR: RPR: NONREACTIVE

## 2015-06-22 LAB — ABO/RH: ABO/RH(D): A POS

## 2015-06-22 MED ORDER — ONDANSETRON HCL 4 MG PO TABS: 4.0000 mg | ORAL_TABLET | ORAL | Status: DC | PRN

## 2015-06-22 MED ORDER — ONDANSETRON HCL 4 MG/2ML IJ SOLN
4.0000 mg | Freq: Four times a day (QID) | INTRAMUSCULAR | Status: DC | PRN
  Administered 2015-06-22: 4 mg via INTRAVENOUS
  Filled 2015-06-22: qty 2

## 2015-06-22 MED ORDER — SENNOSIDES-DOCUSATE SODIUM 8.6-50 MG PO TABS
2.0000 | ORAL_TABLET | ORAL | Status: DC
  Administered 2015-06-22: 2 via ORAL
  Filled 2015-06-22: qty 2

## 2015-06-22 MED ORDER — OXYCODONE-ACETAMINOPHEN 5-325 MG PO TABS: 1.0000 | ORAL_TABLET | ORAL | Status: DC | PRN

## 2015-06-22 MED ORDER — LACTATED RINGERS IV SOLN: INTRAVENOUS | Status: DC

## 2015-06-22 MED ORDER — CITRIC ACID-SODIUM CITRATE 334-500 MG/5ML PO SOLN: 30.0000 mL | ORAL | Status: DC | PRN

## 2015-06-22 MED ORDER — IBUPROFEN 600 MG PO TABS
600.0000 mg | ORAL_TABLET | Freq: Four times a day (QID) | ORAL | Status: DC
  Administered 2015-06-22 – 2015-06-23 (×2): 600 mg via ORAL
  Filled 2015-06-22 (×4): qty 1

## 2015-06-22 MED ORDER — FLEET ENEMA 7-19 GM/118ML RE ENEM: 1.0000 | ENEMA | Freq: Every day | RECTAL | Status: DC | PRN

## 2015-06-22 MED ORDER — ZOLPIDEM TARTRATE 5 MG PO TABS: 5.0000 mg | ORAL_TABLET | Freq: Every evening | ORAL | Status: DC | PRN

## 2015-06-22 MED ORDER — ACETAMINOPHEN 325 MG PO TABS: 650.0000 mg | ORAL_TABLET | ORAL | Status: DC | PRN

## 2015-06-22 MED ORDER — SODIUM CHLORIDE 0.9 % IJ SOLN
3.0000 mL | Freq: Two times a day (BID) | INTRAMUSCULAR | Status: DC
Start: 2015-06-22 — End: 2015-06-23

## 2015-06-22 MED ORDER — EPHEDRINE 5 MG/ML INJ
10.0000 mg | INTRAVENOUS | Status: DC | PRN
  Filled 2015-06-22: qty 2

## 2015-06-22 MED ORDER — BENZOCAINE-MENTHOL 20-0.5 % EX AERO: 1.0000 "application " | INHALATION_SPRAY | CUTANEOUS | Status: DC | PRN

## 2015-06-22 MED ORDER — OXYCODONE-ACETAMINOPHEN 5-325 MG PO TABS: 2.0000 | ORAL_TABLET | ORAL | Status: DC | PRN

## 2015-06-22 MED ORDER — SODIUM CHLORIDE 0.9 % IV SOLN: 250.0000 mL | INTRAVENOUS | Status: DC | PRN

## 2015-06-22 MED ORDER — OXYTOCIN BOLUS FROM INFUSION
500.0000 mL | INTRAVENOUS | Status: DC
Start: 2015-06-22 — End: 2015-06-22

## 2015-06-22 MED ORDER — PHENYLEPHRINE 40 MCG/ML (10ML) SYRINGE FOR IV PUSH (FOR BLOOD PRESSURE SUPPORT)
80.0000 ug | PREFILLED_SYRINGE | INTRAVENOUS | Status: DC | PRN
  Filled 2015-06-22: qty 2

## 2015-06-22 MED ORDER — OXYTOCIN 40 UNITS IN LACTATED RINGERS INFUSION - SIMPLE MED: 62.5000 mL/h | INTRAVENOUS | Status: DC | PRN

## 2015-06-22 MED ORDER — WITCH HAZEL-GLYCERIN EX PADS: 1.0000 "application " | MEDICATED_PAD | CUTANEOUS | Status: DC | PRN

## 2015-06-22 MED ORDER — DIPHENHYDRAMINE HCL 50 MG/ML IJ SOLN: 12.5000 mg | INTRAMUSCULAR | Status: DC | PRN

## 2015-06-22 MED ORDER — LACTATED RINGERS IV SOLN: 500.0000 mL | INTRAVENOUS | Status: DC | PRN

## 2015-06-22 MED ORDER — LIDOCAINE HCL (PF) 1 % IJ SOLN
30.0000 mL | INTRAMUSCULAR | Status: DC | PRN
  Filled 2015-06-22: qty 30

## 2015-06-22 MED ORDER — SODIUM CHLORIDE 0.9 % IJ SOLN: 3.0000 mL | INTRAMUSCULAR | Status: DC | PRN

## 2015-06-22 MED ORDER — OXYTOCIN 40 UNITS IN LACTATED RINGERS INFUSION - SIMPLE MED
62.5000 mL/h | INTRAVENOUS | Status: DC
  Filled 2015-06-22: qty 1000

## 2015-06-22 MED ORDER — BISACODYL 10 MG RE SUPP: 10.0000 mg | Freq: Every day | RECTAL | Status: DC | PRN

## 2015-06-22 MED ORDER — DIBUCAINE 1 % RE OINT: 1.0000 "application " | TOPICAL_OINTMENT | RECTAL | Status: DC | PRN

## 2015-06-22 MED ORDER — PRENATAL MULTIVITAMIN CH
1.0000 | ORAL_TABLET | Freq: Every day | ORAL | Status: DC
  Administered 2015-06-22 – 2015-06-23 (×2): 1 via ORAL
  Filled 2015-06-22 (×2): qty 1

## 2015-06-22 MED ORDER — FENTANYL CITRATE (PF) 100 MCG/2ML IJ SOLN
100.0000 ug | INTRAMUSCULAR | Status: DC | PRN
  Administered 2015-06-22: 100 ug via INTRAVENOUS
  Filled 2015-06-22: qty 2

## 2015-06-22 MED ORDER — FENTANYL 2.5 MCG/ML BUPIVACAINE 1/10 % EPIDURAL INFUSION (WH - ANES): 14.0000 mL/h | INTRAMUSCULAR | Status: DC | PRN

## 2015-06-22 MED ORDER — LANOLIN HYDROUS EX OINT: TOPICAL_OINTMENT | CUTANEOUS | Status: DC | PRN

## 2015-06-22 MED ORDER — ONDANSETRON HCL 4 MG/2ML IJ SOLN: 4.0000 mg | INTRAMUSCULAR | Status: DC | PRN

## 2015-06-22 MED ORDER — DIPHENHYDRAMINE HCL 25 MG PO CAPS: 25.0000 mg | ORAL_CAPSULE | Freq: Four times a day (QID) | ORAL | Status: DC | PRN

## 2015-06-22 MED ORDER — SIMETHICONE 80 MG PO CHEW: 80.0000 mg | CHEWABLE_TABLET | ORAL | Status: DC | PRN

## 2015-06-22 NOTE — H&P (Signed)
LABOR ADMISSION HISTORY AND PHYSICAL  Teresa Olsen is a 24 y.o. female [phone] with IUP at 5572w1d by LMP presenting for labor evaluation having made cervical change in MAU. She reports +FMs, No LOF, no VB, no blurry vision, headaches or peripheral edema, and RUQ pain.  She plans on breast feeding. She request POPs for birth control.  Dating: By 10054w4d sono --->  Estimated Date of Delivery: 06/21/15   Prenatal History/Complications:  Past Medical History: Past Medical History  Diagnosis Date  . PCOS (polycystic ovarian syndrome)   . AVWUJWJX(914.7Headache(784.0)     Past Surgical History: Past Surgical History  Procedure Laterality Date  . Tonsillectomy      Obstetrical History: OB History    Gravida Para Term Preterm AB TAB SAB Ectopic Multiple Living   5 1 1  0 3 0 3 0 0 1      Social History: History   Social History  . Marital Status: Significant Other    Spouse Name: N/A  . Number of Children: N/A  . Years of Education: N/A   Social History Main Topics  . Smoking status: Never Smoker   . Smokeless tobacco: Never Used  . Alcohol Use: No  . Drug Use: No  . Sexual Activity: Yes    Birth Control/ Protection: None   Other Topics Concern  . None   Social History Narrative    Family History: Family History  Problem Relation Age of Onset  . Hypertension Father   . Hearing loss Neg Hx   . Hyperlipidemia Mother   . Diabetes Mother     Allergies: No Known Allergies  Prescriptions prior to admission  Medication Sig Dispense Refill Last Dose  . Prenatal Vit-Min-FA-Fish Oil ([employer] PRENATAL GUMMY PO) Take 1 tablet by mouth daily.   Past Week at Unknown time     Review of Systems   All systems reviewed and negative except as stated in HPI  Blood pressure 126/75, pulse 77, temperature 98.3 F (36.8 C), temperature source Oral, resp. rate 18, height 5\' 5"  (1.651 m), weight 182 lb (82.555 kg), last menstrual period 09/11/2014, SpO2 100 %, unknown if currently  breastfeeding. General appearance: alert and cooperative Lungs: clear to auscultation bilaterally Heart: regular rate and rhythm Abdomen: soft, non-tender; bowel sounds normal Extremities: Homans sign is negative, no sign of DVT DTR's 2+ Presentation: cephalic Fetal monitoringBaseline: 135 bpm, Variability: Good {> 6 bpm), Accelerations: Reactive and Decelerations: Absent Uterine activity q2-264min  Dilation: 5 Effacement (%): 90 Station: -3   Prenatal labs: ABO, Rh: --/--/A POS (06/28 [id number]) Antibody: NEG (06/28 0655) Rubella:   RPR: Nonreactive (12/31 0000)  HBsAg:    HIV: Non-reactive (12/31 0000)  GBS: Negative (06/02 0000)  1 hr Glucola 123 Genetic screening  declined Anatomy US normal  Prenatal Transfer Tool  Maternal Diabetes: No Genetic Screening: Declined Maternal Ultrasounds/Referrals: Normal Fetal Ultrasounds or other Referrals:  None Maternal Substance Abuse:  No Significant Maternal Medications:  None Significant Maternal Lab Results: None  Results for orders placed or performed during the hospital encounter of 06/22/15 (from the past 24 hour(s))  CBC   Collection Time: 06/22/15  6:55 AM  Result Value Ref Range   WBC 8.3 4.0 - 10.5 K/uL   RBC 3.75 (L) 3.87 - 5.11 MIL/uL   Hemoglobin 10.9 (L) 12.0 - 15.0 g/dL   HCT 62.132.6 (L) 30.836.0 - 65.746.0 %   MCV 86.9 78.0 - 100.0 fL   MCH 29.1 26.0 - 34.0 pg   MCHC  33.4 30.0 - 36.0 g/dL   RDW 16.1 (H) 09.6 - 04.5 %   Platelets 219 150 - 400 K/uL  Type and screen   Collection Time: 06/22/15  6:55 AM  Result Value Ref Range   ABO/RH(D) A POS    Antibody Screen NEG    Sample Expiration 06/25/2015     Patient Active Problem List   Diagnosis Date Noted  . Active labor at term 06/22/2015  . Encounter for fetal anatomic survey   . [redacted] weeks gestation of pregnancy   . UTI (lower urinary tract infection) 06/21/2014  . SAB (spontaneous abortion) 06/11/2014    Assessment: Teresa Olsen is a 24 y.o. [phone] at  [redacted]w[redacted]d here in active labor  #Labor:expectant mgmt #Pain: Prn fentanyl #FWB: Cat I #ID:  GBS neg #MOF: breast #MOC:POPS  Teresa Olsen 06/22/2015, 8:52 AM

## 2015-06-22 NOTE — MAU Note (Signed)
Contractions  

## 2015-06-23 MED ORDER — NORETHINDRONE 0.35 MG PO TABS: 1.0000 | ORAL_TABLET | Freq: Every day | ORAL | Status: DC

## 2015-06-23 MED ORDER — IBUPROFEN 600 MG PO TABS
600.0000 mg | ORAL_TABLET | Freq: Four times a day (QID) | ORAL | Status: DC
Start: 2015-06-23 — End: 2019-03-28

## 2015-06-23 NOTE — Progress Notes (Signed)
UR chart review completed.  

## 2015-06-23 NOTE — Discharge Instructions (Signed)

## 2015-06-23 NOTE — Discharge Summary (Signed)
Obstetric Discharge Summary Reason for Admission: onset of labor Prenatal Procedures: ultrasound Intrapartum Procedures: spontaneous vaginal delivery Postpartum Procedures: none Complications-Operative and Postpartum: none HEMOGLOBIN  Date Value Ref Range Status  06/22/2015 10.9* 12.0 - 15.0 g/dL Final   HCT  Date Value Ref Range Status  06/22/2015 32.6* 36.0 - 46.0 % Final    Physical Exam:  General: alert, cooperative and no distress Lochia: appropriate Uterine Fundus: firm Incision: na DVT Evaluation: No evidence of DVT seen on physical exam.  Discharge Diagnoses: Term Pregnancy-delivered  Discharge Information: Date: 06/23/2015 Activity: unrestricted Diet: routine Medications: Ibuprofen Condition: stable Instructions: refer to practice specific booklet Discharge to: home   Newborn Data: Live born female  Birth Weight: 7 lb 0.5 oz (3189 g) APGAR: 9, 10  Home with mother.  Teresa Olsen, Teresa Olsen 06/23/2015, 7:56 AM

## 2015-06-23 NOTE — Lactation Note (Addendum)
This note was copied from the chart of Teresa Olsen. Lactation Consultation Note  Experience BF mother has a history of low MS.  She exclusively BF into the second week but was advised by baby's doctor to add formula at this time.  She reports that she did not have much support.  Available outpatient support was reviewed with her.  Breast feeding initiation has been better with this baby.  Signs of good milk transfer was discussed as was a plan if baby did not soften the breast.  Encouraged to post-pump 4 times in 24 hours to help with lactation induction as it was noted that her lower breast quadrants were not well developed. Advised to seek IBCLC support if her milk was not coming to volume by day 4.  Understanding verbalized. Patient Name: Teresa Olsen ZOXWR'UToday's Date: 06/23/2015 Reason for consult: Initial assessment   Maternal Data Has patient been taught Hand Expression?: Yes Does the patient have breastfeeding experience prior to this delivery?: Yes  Feeding Feeding Type: Breast Fed  [facility]                      Lactation Tools Discussed/Used     Consult Status      Teresa Olsen, Teresa Olsen 06/23/2015, 8:55 AM

## 2015-06-23 NOTE — Progress Notes (Signed)
MOB was referred for history of depression/anxiety.  Referral is screened out by Clinical Social Worker because none of the following criteria appear to apply: -History of anxiety/depression during this pregnancy, or of post-partum depression. - Diagnosis of anxiety and/or depression within last 3 years or -MOB's symptoms are currently being treated with medication and/or therapy.   CSW completed chart review. Per chart review, MOB is currently participating in therapy.  Please contact the Clinical Social Worker if needs arise or upon MOB request.   Loleta BooksSarah Brisha Mccabe, LCSW [phone]

## 2018-02-07 ENCOUNTER — Other Ambulatory Visit: Payer: Self-pay

## 2018-02-07 ENCOUNTER — Encounter (HOSPITAL_COMMUNITY): Payer: Self-pay | Admitting: Emergency Medicine

## 2018-02-07 ENCOUNTER — Emergency Department (HOSPITAL_COMMUNITY): Payer: Self-pay

## 2018-02-07 ENCOUNTER — Emergency Department (HOSPITAL_COMMUNITY)
Admission: EM | Admit: 2018-02-07 | Discharge: 2018-02-07 | Disposition: A | Discharge status: Home / Self Care | Payer: Self-pay | Attending: Emergency Medicine | Admitting: Emergency Medicine

## 2018-02-07 DIAGNOSIS — E282 Polycystic ovarian syndrome: Secondary | ICD-10-CM | POA: Insufficient documentation

## 2018-02-07 DIAGNOSIS — R1011 Right upper quadrant pain: Secondary | ICD-10-CM | POA: Insufficient documentation

## 2018-02-07 DIAGNOSIS — R112 Nausea with vomiting, unspecified: Secondary | ICD-10-CM | POA: Insufficient documentation

## 2018-02-07 DIAGNOSIS — R10811 Right upper quadrant abdominal tenderness: Secondary | ICD-10-CM | POA: Insufficient documentation

## 2018-02-07 LAB — COMPREHENSIVE METABOLIC PANEL
ALBUMIN: 4.4 g/dL (ref 3.5–5.0)
ALT: 31 U/L (ref 14–54)
AST: 22 U/L (ref 15–41)
Alkaline Phosphatase: 64 U/L (ref 38–126)
Anion gap: 11 (ref 5–15)
BILIRUBIN TOTAL: 0.8 mg/dL (ref 0.3–1.2)
BUN: 9 mg/dL (ref 6–20)
CALCIUM: 9.1 mg/dL (ref 8.9–10.3)
CO2: 23 mmol/L (ref 22–32)
Chloride: 107 mmol/L (ref 101–111)
Creatinine, Ser: 0.7 mg/dL (ref 0.44–1.00)
GFR calc Af Amer: >60 mL/min (ref >60)
GFR calc non Af Amer: >60 mL/min (ref >60)
GLUCOSE: 97 mg/dL (ref 65–99)
Potassium: 4 mmol/L (ref 3.5–5.1)
Sodium: 141 mmol/L (ref 135–145)
TOTAL PROTEIN: 7.9 g/dL (ref 6.5–8.1)

## 2018-02-07 LAB — CBC
HCT: 40 % (ref 36.0–46.0)
Hemoglobin: 13.3 g/dL (ref 12.0–15.0)
MCH: 29.8 pg (ref 26.0–34.0)
MCHC: 33.3 g/dL (ref 30.0–36.0)
MCV: 89.7 fL (ref 78.0–100.0)
Platelets: 270 10*3/uL (ref 150–400)
RBC: 4.46 MIL/uL (ref 3.87–5.11)
RDW: 14.8 % (ref 11.5–15.5)
WBC: 8.2 10*3/uL (ref 4.0–10.5)

## 2018-02-07 LAB — URINALYSIS, ROUTINE W REFLEX MICROSCOPIC
BILIRUBIN URINE: NEGATIVE
Glucose, UA: NEGATIVE mg/dL
Hgb urine dipstick: NEGATIVE
Ketones, ur: NEGATIVE mg/dL
Leukocytes, UA: NEGATIVE
NITRITE: NEGATIVE
Protein, ur: NEGATIVE mg/dL
Specific Gravity, Urine: 1.025 (ref 1.005–1.030)
pH: 7 (ref 5.0–8.0)

## 2018-02-07 LAB — I-STAT BETA HCG BLOOD, ED (MC, WL, AP ONLY)

## 2018-02-07 LAB — LIPASE, BLOOD: Lipase: 32 U/L (ref 11–51)

## 2018-02-07 MED ORDER — MORPHINE SULFATE (PF) 4 MG/ML IV SOLN
4.0000 mg | Freq: Once | INTRAVENOUS | Status: AC
  Administered 2018-02-07: 4 mg via INTRAVENOUS
  Filled 2018-02-07: qty 1

## 2018-02-07 MED ORDER — METOCLOPRAMIDE HCL 10 MG PO TABS: 10.0000 mg | ORAL_TABLET | Freq: Four times a day (QID) | ORAL | 0 refills | Status: DC

## 2018-02-07 MED ORDER — ONDANSETRON HCL 4 MG/2ML IJ SOLN
4.0000 mg | Freq: Once | INTRAMUSCULAR | Status: AC
  Administered 2018-02-07: 4 mg via INTRAVENOUS
  Filled 2018-02-07: qty 2

## 2018-02-07 MED ORDER — SODIUM CHLORIDE 0.9 % IV BOLUS (SEPSIS)
1000.0000 mL | Freq: Once | INTRAVENOUS | Status: AC
  Administered 2018-02-07: 1000 mL via INTRAVENOUS

## 2018-02-07 MED ORDER — HYDROCODONE-ACETAMINOPHEN 5-325 MG PO TABS: 1.0000 | ORAL_TABLET | ORAL | 0 refills | Status: DC | PRN

## 2018-02-07 NOTE — ED Provider Notes (Signed)
[facility] Provider Note   CSN: [id number] Arrival date & time: 02/07/18  0026     History   Chief Complaint Chief Complaint  Patient presents with  . Abdominal Pain    HPI Teresa Olsen is a 27 y.o. female.  HPI   Teresa Olsen is a 27 y.o. female, with a history of PCOS, presenting to the ED with abdominal pain beginning yesterday. Pain is RUQ, radiating through to the back, 7/10, "feels like a charlie horse, but worse," intermittent. Accompanied by nausea and vomiting with about three episodes of nonbloody, nonbilious emesis.  States she has known gallstones and has "gallbladder pain" intermittently for the past few years. Was told to schedule a surgery date for cholecystectomy, but did not do so.  Last BM yesterday and normal. Does not have a menstrual cycle due to her OCPs. Last food was yesterday around 1pm.  Denies fever/chills, diarrhea, hematochezia/melena, hematemesis, dysuria, hematuria, or any other compaints. Patient is currently lactating and breastfeeding a 27 year old infant. She was counseled on precautions for breastfeeding after receiving analgesics and antiemetics.   Past Medical History:  Diagnosis Date  . Headache(784.0)   . PCOS (polycystic ovarian syndrome)     Patient Active Problem List   Diagnosis Date Noted  . Active labor at term 06/22/2015  . Encounter for fetal anatomic survey   . UTI (lower urinary tract infection) 06/21/2014  . SAB (spontaneous abortion) 06/11/2014    Past Surgical History:  Procedure Laterality Date  . TONSILLECTOMY      OB History    Gravida Para Term Preterm AB Living   5 2 2  0 3 2   SAB TAB Ectopic Multiple Live Births   3 0 0 0 2       Home Medications    Prior to Admission medications   Medication Sig Start Date End Date Taking? Authorizing Provider  HYDROcodone-acetaminophen (NORCO/VICODIN) 5-325 MG tablet Take 1-2 tablets by mouth every 4  (four) hours as needed for severe pain. 02/07/18   Weda Baumgarner C, PA-C  ibuprofen (ADVIL,MOTRIN) 600 MG tablet Take 1 tablet (600 mg total) by mouth every 6 (six) hours. 06/23/15   Willodean RosenthalHarraway-Smith, Carolyn, MD  metoCLOPramide (REGLAN) 10 MG tablet Take 1 tablet (10 mg total) by mouth every 6 (six) hours. 02/07/18   Milind Raether C, PA-C  norethindrone (MICRONOR,CAMILA,ERRIN) 0.35 MG tablet Take 1 tablet (0.35 mg total) by mouth daily. 06/23/15   Willodean RosenthalHarraway-Smith, Carolyn, MD  Prenatal Vit-Min-FA-Fish Oil ([employer] PRENATAL GUMMY PO) Take 1 tablet by mouth daily.    [provider]    Family History Family History  Problem Relation Age of Onset  . Hypertension Father   . Hyperlipidemia Mother   . Diabetes Mother   . Hearing loss Neg Hx     Social History Social History   Tobacco Use  . Smoking status: Never Smoker  . Smokeless tobacco: Never Used  Substance Use Topics  . Alcohol use: No  . Drug use: No     Allergies   Patient has no known allergies.   Review of Systems Review of Systems  Constitutional: Negative for chills, diaphoresis and fever.  Respiratory: Negative for shortness of breath.   Cardiovascular: Negative for chest pain.  Gastrointestinal: Positive for abdominal pain, nausea and vomiting. Negative for blood in stool and diarrhea.  Genitourinary: Negative for dysuria, frequency and hematuria.  All other systems reviewed and are negative.    Physical Exam  Updated Vital Signs BP 132/77 (BP Location: Right Arm)   Pulse (!) 109   Temp 98.9 F (37.2 C) (Oral)   Resp 16   Ht 5\' 5"  (1.651 m)   Wt 91.2 kg (201 lb)   SpO2 100%   BMI 33.45 kg/m   Physical Exam  Constitutional: She appears well-developed and well-nourished. No distress.  HENT:  Head: Normocephalic and atraumatic.  Eyes: Conjunctivae are normal.  Neck: Neck supple.  Cardiovascular: Normal rate, regular rhythm, normal heart sounds and intact distal pulses.  Not tachycardic upon my exam.    Pulmonary/Chest: Effort normal and breath sounds normal. No respiratory distress.  Abdominal: Soft. Normal appearance and bowel sounds are normal. There is tenderness in the right upper quadrant. There is no guarding, no CVA tenderness and negative Murphy's sign.  Musculoskeletal: She exhibits no edema.  Lymphadenopathy:    She has no cervical adenopathy.  Neurological: She is alert.  Skin: Skin is warm and dry. She is not diaphoretic.  Psychiatric: She has a normal mood and affect. Her behavior is normal.  Nursing note and vitals reviewed.    ED Treatments / Results  Labs (all labs ordered are listed, but only abnormal results are displayed) Labs Reviewed  URINALYSIS, ROUTINE W REFLEX MICROSCOPIC - Abnormal; Notable for the following components:      Result Value   APPearance HAZY (*)    All other components within normal limits  LIPASE, BLOOD  COMPREHENSIVE METABOLIC PANEL  CBC  I-STAT BETA HCG BLOOD, ED ([facility], [facility], [facility] ONLY)    EKG  EKG Interpretation None       Radiology US Abdomen Limited Ruq  Result Date: 02/07/2018 CLINICAL DATA:  Right upper quadrant tenderness EXAM: ULTRASOUND ABDOMEN LIMITED RIGHT UPPER QUADRANT COMPARISON:  None. FINDINGS: Gallbladder: Gallbladder is contracted and filled with calculi. Largest individual gallstone measures 1.5 cm in length. The gallbladder wall does not appear appreciably thickened, and there is no evident pericholecystic fluid. No sonographic Murphy sign noted by sonographer. Common bile duct: Diameter: 4 mm. No intrahepatic or extrahepatic biliary duct dilatation. Liver: No focal lesion identified. Within normal limits in parenchymal echogenicity. Portal vein is patent on color Doppler imaging with normal direction of blood flow towards the liver. IMPRESSION: Gallbladder is contracted and filled with calculi. There is no pericholecystic fluid or appreciable gallbladder wall thickening. Study otherwise unremarkable. Electronically Signed    By: Bretta Bang III M.D.   On: 02/07/2018 08:15    Procedures Procedures (including critical care time)  Medications Ordered in ED Medications  sodium chloride 0.9 % bolus 1,000 mL (1,000 mLs Intravenous New Bag/Given 02/07/18 0727)  ondansetron (ZOFRAN) injection 4 mg (4 mg Intravenous Given 02/07/18 0727)  morphine 4 MG/ML injection 4 mg (4 mg Intravenous Given 02/07/18 0728)     Initial Impression / Assessment and Plan / ED Course  I have reviewed the triage vital signs and the nursing notes.  Pertinent labs & imaging results that were available during my care of the patient were reviewed by me and considered in my medical decision making (see chart for details).     Patient presents with right upper quadrant pain.  No Murphy sign on exam.  No elevation in LFTs or lipase.  Patient is nontoxic appearing, afebrile, not tachycardic, not tachypneic, not hypotensive, and is in no apparent distress.  Cholelithiasis without evidence of cholecystitis or other acute abnormality noted on ultrasound.  General surgery follow-up. The patient was given instructions for home care as  well as return precautions. Patient voices understanding of these instructions, accepts the plan, and is comfortable with discharge.   Vitals:   02/07/18 0101 02/07/18 0110 02/07/18 0715  BP: 132/77  115/85  Pulse: (!) 109  84  Resp: 16    Temp: 98.9 F (37.2 C)    TempSrc: Oral    SpO2: 100%  98%  Weight:  91.2 kg (201 lb)   Height:  5\' 5"  (1.651 m)      Final Clinical Impressions(s) / ED Diagnoses   Final diagnoses:  RUQ pain  RUQ abdominal tenderness    ED Discharge Orders        Ordered    HYDROcodone-acetaminophen (NORCO/VICODIN) 5-325 MG tablet  Every 4 hours PRN     02/07/18 0835    metoCLOPramide (REGLAN) 10 MG tablet  Every 6 hours     02/07/18 0835       Anselm Pancoast, PA-C 02/07/18 0842    Ward, Layla Maw, DO 02/07/18 2303

## 2018-02-07 NOTE — ED Notes (Signed)
Tolerated water well. No n/v

## 2018-02-07 NOTE — ED Triage Notes (Signed)
Pt c/o 10/10 right side abd pain with nausea and vomiting pt states she has a hx of gallbladder problem.  No fever or chills.

## 2018-02-07 NOTE — Discharge Instructions (Signed)
You still have evidence of gallstones, but no evidence of gallbladder inflammation.  Antiinflammatory medications: May use ibuprofen or naproxen as needed for pain.  Take these medications with food to avoid upset stomach. Choose only one of these medications, do not take them together. Be sure to speak with your OBGYN or pediatrician for their opinion on using naproxen or ibuprofen while breast-feeding. Tylenol: Tylenol is the safest pain medication that can be used while breast-feeding.  Your daily total maximum amount of tylenol from all sources should be limited to 4000mg /day for persons without liver problems, or 2000mg /day for those with liver problems. Vicodin: May take Vicodin as needed for severe pain.  Do not drive or perform other dangerous activities while taking the Vicodin.  Please note that each pill of Vicodin contains 325 mg of Tylenol and the above dosage limits apply.  Use caution as the narcotic continue Vicodin, hydrocodone, can be passed through the breast milk.  This can cause drowsiness, sedation, and in extreme cases difficulty breathing in breast-fed infants.  If possible, it is advised that you use alternative food sources for the child within 6 hours of taking the Vicodin. Reglan: May use the Reglan, as needed, for nausea/vomiting.  Follow-up with the general surgeon as soon as possible.  Call the number provided to set up an appointment.

## 2018-02-07 NOTE — ED Notes (Signed)
Pt given water for PO challenge 

## 2018-03-04 IMAGING — US US ABDOMEN LIMITED
1 series · 14 of 25 positions shown · non-contrast
Comparison: None.

CLINICAL DATA: Right upper quadrant tenderness

EXAM:
ULTRASOUND ABDOMEN LIMITED RIGHT UPPER QUADRANT

[Series 1: us abdomen limited · 0.26mm/px · 14 of 40 slices shown]
[im 1/40]
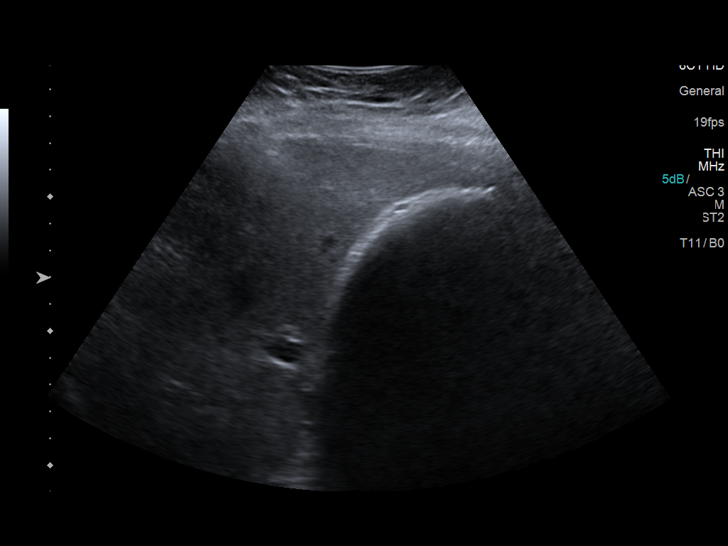
[im 4/40]
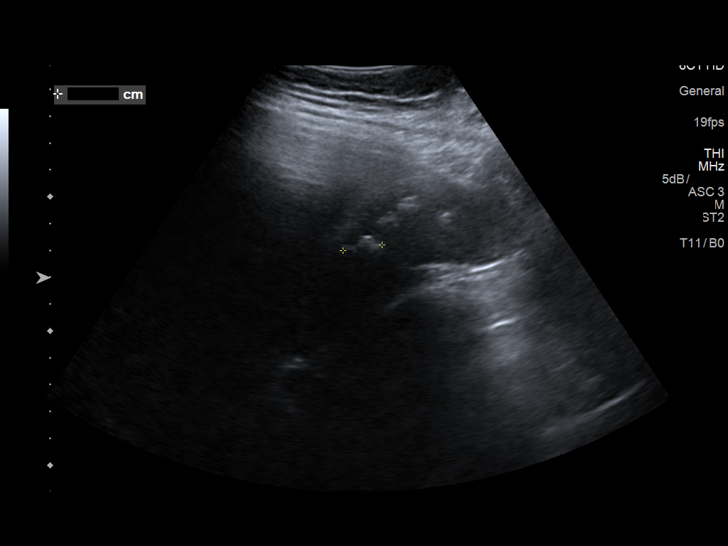
[im 7/40]
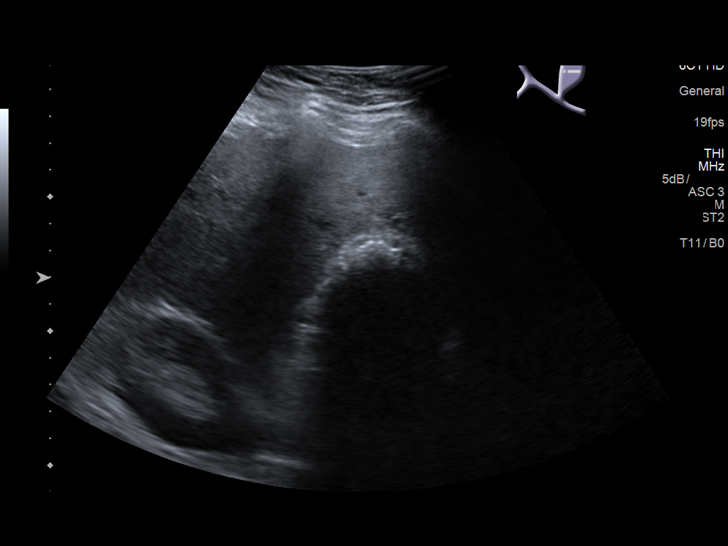
[im 10/40]
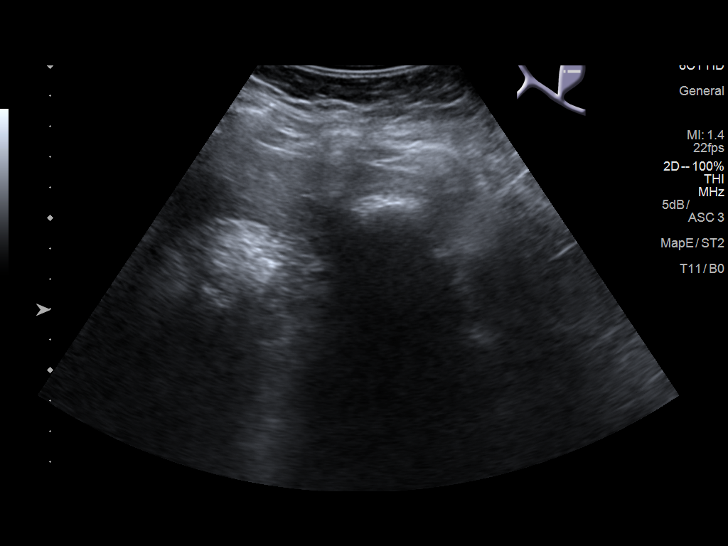
[im 14/40]
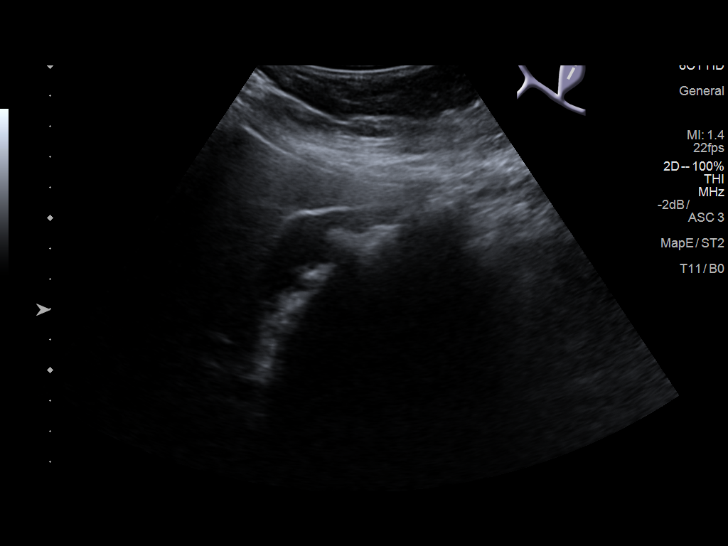
[im 15/40]
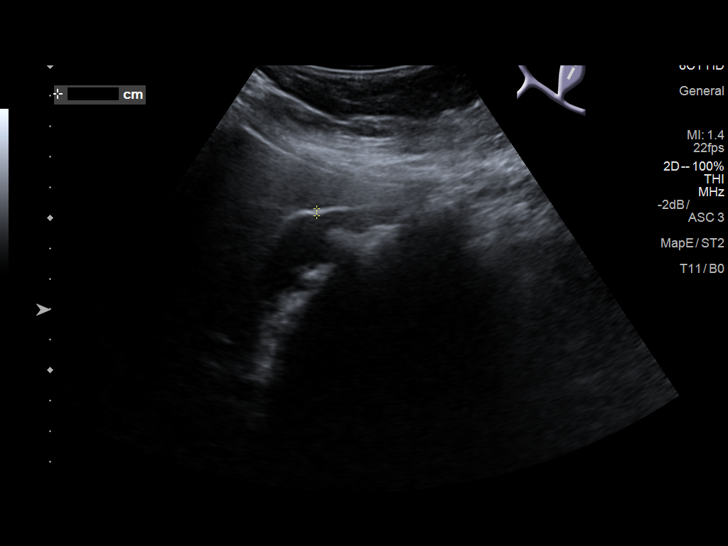
[im 18/40]
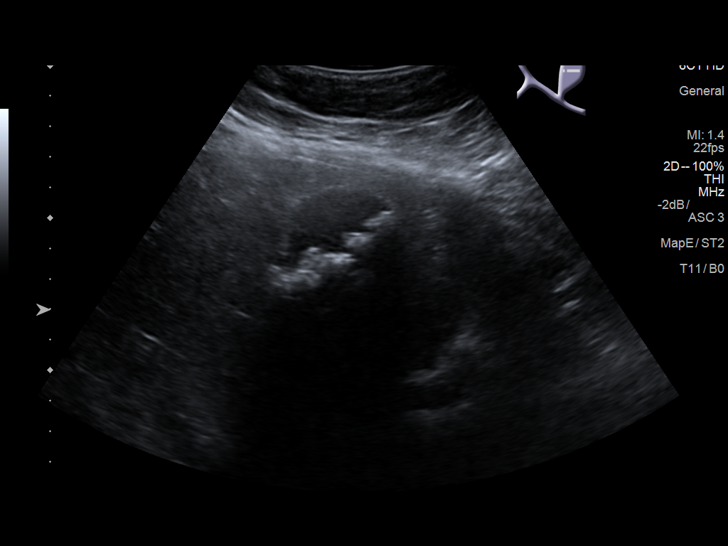
[im 22/40]
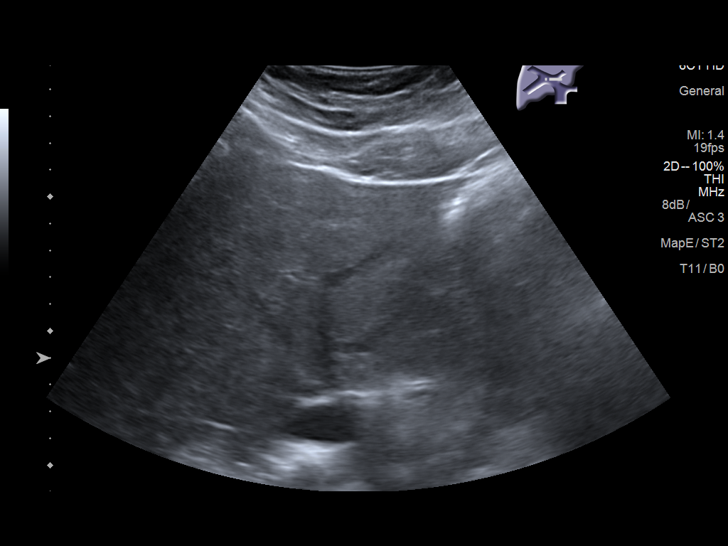
[im 25/40]
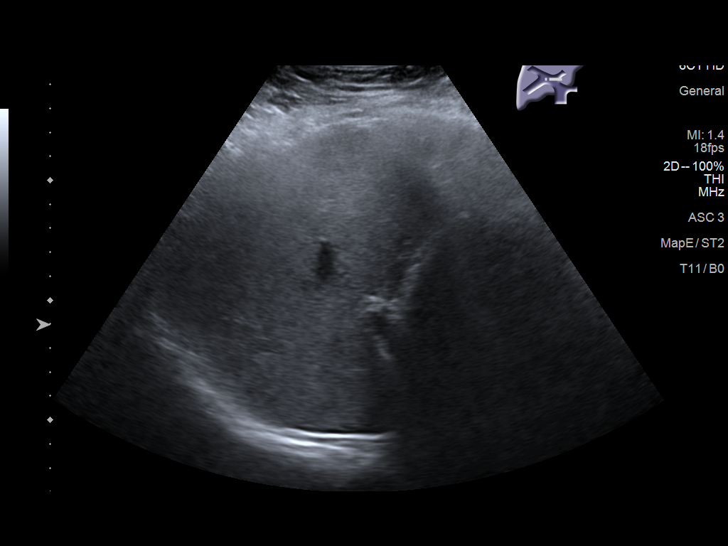
[im 27/40]
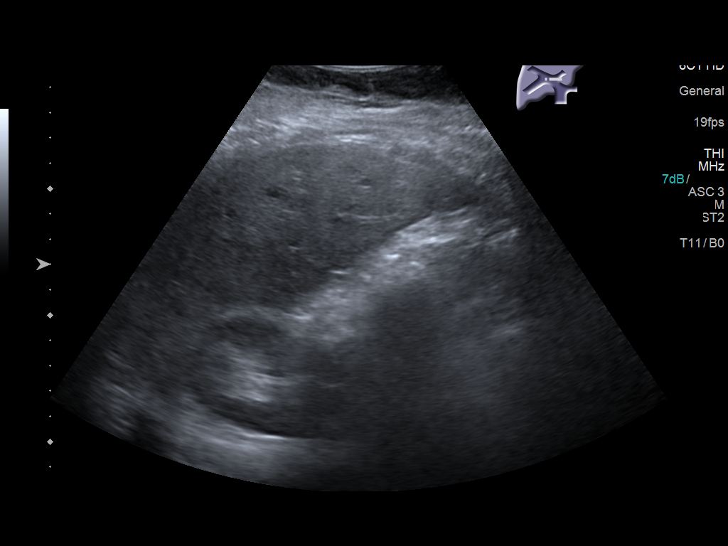
[im 30/40]
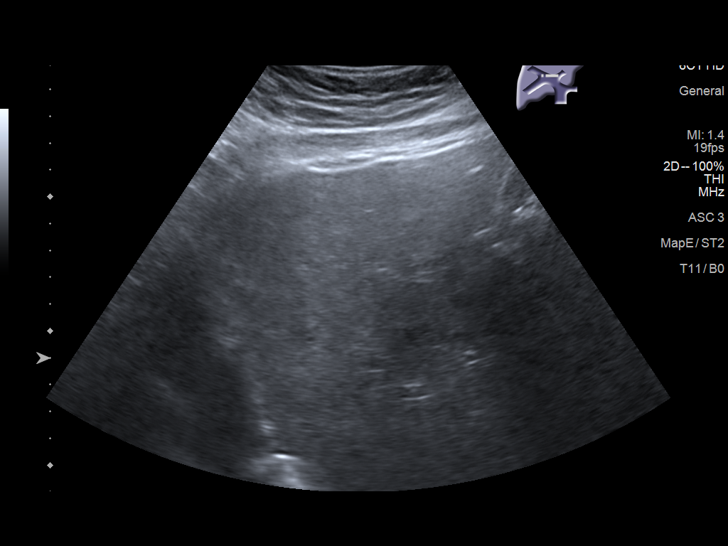
[im 33/40]
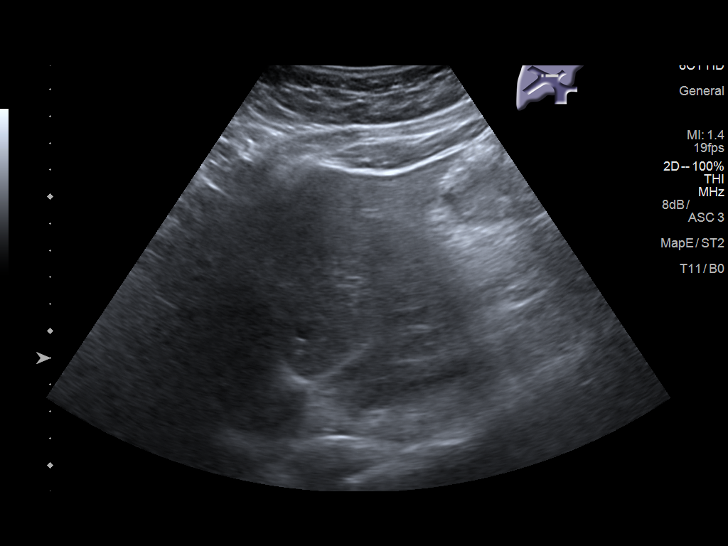
[im 36/40]
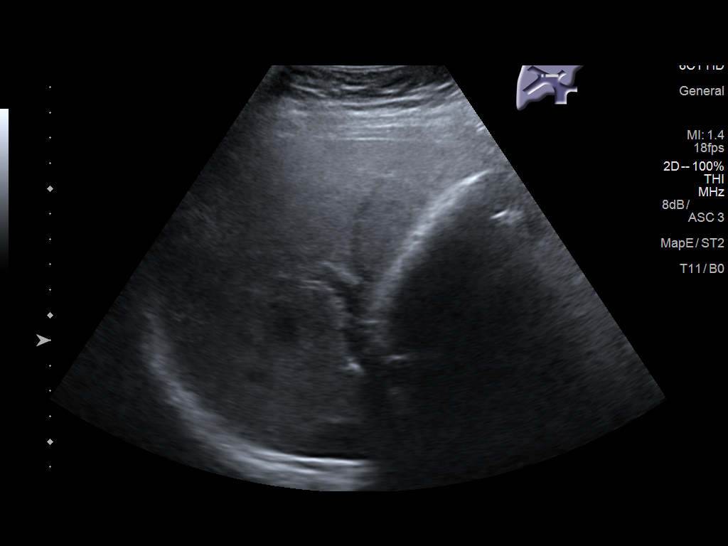
[im 40/40]
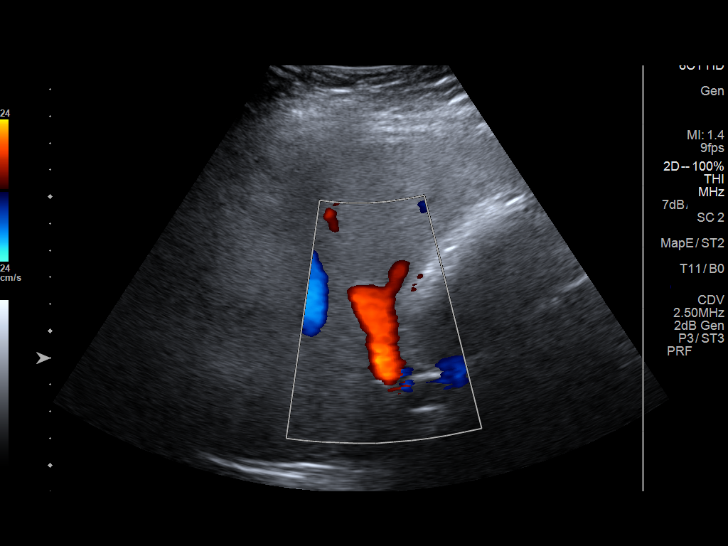

[14 of 25 positions shown; findings below may reference images not displayed]

FINDINGS: Gallbladder:

Gallbladder is contracted and filled with calculi. Largest
individual gallstone measures 1.5 cm in length. The gallbladder wall
does not appear appreciably thickened, and there is no evident
pericholecystic fluid. No sonographic Murphy sign noted by
sonographer.

Common bile duct:

Diameter: 4 mm. No intrahepatic or extrahepatic biliary duct
dilatation.

Liver:

No focal lesion identified. Within normal limits in parenchymal
echogenicity. Portal vein is patent on color Doppler imaging with
normal direction of blood flow towards the liver.
IMPRESSION: Gallbladder is contracted and filled with calculi. There is no
pericholecystic fluid or appreciable gallbladder wall thickening.
Study otherwise unremarkable.

## 2018-10-31 LAB — OB RESULTS CONSOLE HIV ANTIBODY (ROUTINE TESTING): HIV: NONREACTIVE

## 2018-10-31 LAB — OB RESULTS CONSOLE GC/CHLAMYDIA
Chlamydia: NEGATIVE
Gonorrhea: NEGATIVE

## 2018-10-31 LAB — OB RESULTS CONSOLE VARICELLA ZOSTER ANTIBODY, IGG: Varicella: NON-IMMUNE/NOT IMMUNE

## 2018-10-31 LAB — OB RESULTS CONSOLE HEPATITIS B SURFACE ANTIGEN: Hepatitis B Surface Ag: NEGATIVE

## 2018-10-31 LAB — OB RESULTS CONSOLE RUBELLA ANTIBODY, IGM: Rubella: IMMUNE

## 2018-12-25 NOTE — L&D Delivery Note (Addendum)
Patient: Teresa Olsen MRN: [id number]  GBS status: Negative, IAP given: None   Patient is a 28 y.o. now G7P4 s/p NSVD at [redacted]w[redacted]d, who was admitted for SOL. AROM 0h 66m prior to delivery with clear fluid.    Delivery Note At 6:20 PM a viable female was delivered via Vaginal, Spontaneous (Presentation: LOA).  APGAR: 9, 10; weight 6 lb 12.3 oz (3070 g).   Placenta status: spontaneous, intact.  Cord: 3 vessel with the following complications: nuchal cord x1.    Anesthesia:  None  Episiotomy: None Lacerations: None Suture Repair: None Est. Blood Loss (mL): 110  Head delivered LOA. Nuchal cord present, reduced at perineum. Shoulder and body delivered in usual fashion. Infant with spontaneous cry, placed on mother's abdomen, dried and bulb suctioned. Cord clamped x 2 after 1-minute delay, and cut by family member. Cord blood drawn. Placenta delivered spontaneously with gentle cord traction. Uterine atony noted after delivery with brisk bleeding. Fundus firm with massage, IM Methergine and Pitocin. Perineum inspected and found to have no lacerations.   Mom to postpartum.  Baby to Couplet care / Skin to Skin.  De Hollingshead 03/28/2019, 7:38 PM

## 2019-03-06 LAB — OB RESULTS CONSOLE GC/CHLAMYDIA
Chlamydia: NEGATIVE
Gonorrhea: NEGATIVE

## 2019-03-06 LAB — OB RESULTS CONSOLE GBS: GBS: NEGATIVE

## 2019-03-19 ENCOUNTER — Encounter: Payer: Self-pay | Admitting: Advanced Practice Midwife

## 2019-03-19 DIAGNOSIS — Z3480 Encounter for supervision of other normal pregnancy, unspecified trimester: Secondary | ICD-10-CM | POA: Insufficient documentation

## 2019-03-28 ENCOUNTER — Other Ambulatory Visit: Payer: Self-pay

## 2019-03-28 ENCOUNTER — Inpatient Hospital Stay (HOSPITAL_COMMUNITY)
Admission: AD | Admit: 2019-03-28 | Discharge: 2019-03-29 | DRG: 807 | Disposition: A | Discharge status: Home / Self Care | Payer: Medicaid Other | Attending: Obstetrics & Gynecology | Admitting: Obstetrics & Gynecology

## 2019-03-28 ENCOUNTER — Encounter (HOSPITAL_COMMUNITY): Payer: Self-pay

## 2019-03-28 ENCOUNTER — Inpatient Hospital Stay (HOSPITAL_COMMUNITY)
Admission: AD | Admit: 2019-03-28 | Discharge: 2019-03-28 | Disposition: A | Discharge status: Home / Self Care | Payer: Medicaid Other | Source: Home / Self Care | Attending: Obstetrics & Gynecology | Admitting: Obstetrics & Gynecology

## 2019-03-28 DIAGNOSIS — O9902 Anemia complicating childbirth: Secondary | ICD-10-CM | POA: Diagnosis present

## 2019-03-28 DIAGNOSIS — Z3A38 38 weeks gestation of pregnancy: Secondary | ICD-10-CM

## 2019-03-28 DIAGNOSIS — Z3A39 39 weeks gestation of pregnancy: Secondary | ICD-10-CM

## 2019-03-28 DIAGNOSIS — O26893 Other specified pregnancy related conditions, third trimester: Secondary | ICD-10-CM | POA: Diagnosis present

## 2019-03-28 DIAGNOSIS — O99284 Endocrine, nutritional and metabolic diseases complicating childbirth: Secondary | ICD-10-CM | POA: Diagnosis present

## 2019-03-28 DIAGNOSIS — O9962 Diseases of the digestive system complicating childbirth: Secondary | ICD-10-CM | POA: Diagnosis not present

## 2019-03-28 DIAGNOSIS — O99019 Anemia complicating pregnancy, unspecified trimester: Secondary | ICD-10-CM | POA: Diagnosis present

## 2019-03-28 DIAGNOSIS — D649 Anemia, unspecified: Secondary | ICD-10-CM | POA: Diagnosis present

## 2019-03-28 DIAGNOSIS — E282 Polycystic ovarian syndrome: Secondary | ICD-10-CM | POA: Diagnosis present

## 2019-03-28 DIAGNOSIS — O471 False labor at or after 37 completed weeks of gestation: Secondary | ICD-10-CM

## 2019-03-28 DIAGNOSIS — Z3480 Encounter for supervision of other normal pregnancy, unspecified trimester: Secondary | ICD-10-CM

## 2019-03-28 HISTORY — DX: Anemia, unspecified: D64.9

## 2019-03-28 LAB — CBC
HCT: 31 % — ABNORMAL LOW (ref 36.0–46.0)
Hemoglobin: 9.5 g/dL — ABNORMAL LOW (ref 12.0–15.0)
MCH: 26.1 pg (ref 26.0–34.0)
MCHC: 30.6 g/dL (ref 30.0–36.0)
MCV: 85.2 fL (ref 80.0–100.0)
Platelets: 207 10*3/uL (ref 150–400)
RBC: 3.64 MIL/uL — ABNORMAL LOW (ref 3.87–5.11)
RDW: 14.7 % (ref 11.5–15.5)
WBC: 8.6 10*3/uL (ref 4.0–10.5)
nRBC: 0 % (ref 0.0–0.2)

## 2019-03-28 LAB — TYPE AND SCREEN
ABO/RH(D): A POS
Antibody Screen: NEGATIVE

## 2019-03-28 LAB — ABO/RH: ABO/RH(D): A POS

## 2019-03-28 MED ORDER — LIDOCAINE HCL (PF) 1 % IJ SOLN: 30.0000 mL | INTRAMUSCULAR | Status: DC | PRN

## 2019-03-28 MED ORDER — WITCH HAZEL-GLYCERIN EX PADS: 1.0000 "application " | MEDICATED_PAD | CUTANEOUS | Status: DC | PRN

## 2019-03-28 MED ORDER — ZOLPIDEM TARTRATE 5 MG PO TABS: 5.0000 mg | ORAL_TABLET | Freq: Every evening | ORAL | Status: DC | PRN

## 2019-03-28 MED ORDER — PRENATAL MULTIVITAMIN CH
1.0000 | ORAL_TABLET | Freq: Every day | ORAL | Status: DC
  Filled 2019-03-28: qty 1

## 2019-03-28 MED ORDER — ACETAMINOPHEN 325 MG PO TABS: 650.0000 mg | ORAL_TABLET | ORAL | Status: DC | PRN

## 2019-03-28 MED ORDER — FENTANYL CITRATE (PF) 100 MCG/2ML IJ SOLN: 100.0000 ug | INTRAMUSCULAR | Status: DC | PRN

## 2019-03-28 MED ORDER — SOD CITRATE-CITRIC ACID 500-334 MG/5ML PO SOLN: 30.0000 mL | ORAL | Status: DC | PRN

## 2019-03-28 MED ORDER — OXYTOCIN BOLUS FROM INFUSION
500.0000 mL | Freq: Once | INTRAVENOUS | Status: AC
  Administered 2019-03-28: 500 mL via INTRAVENOUS

## 2019-03-28 MED ORDER — ONDANSETRON HCL 4 MG/2ML IJ SOLN: 4.0000 mg | INTRAMUSCULAR | Status: DC | PRN

## 2019-03-28 MED ORDER — OXYCODONE-ACETAMINOPHEN 5-325 MG PO TABS: 2.0000 | ORAL_TABLET | ORAL | Status: DC | PRN

## 2019-03-28 MED ORDER — OXYCODONE-ACETAMINOPHEN 5-325 MG PO TABS: 1.0000 | ORAL_TABLET | ORAL | Status: DC | PRN

## 2019-03-28 MED ORDER — SENNOSIDES-DOCUSATE SODIUM 8.6-50 MG PO TABS
2.0000 | ORAL_TABLET | ORAL | Status: DC
  Administered 2019-03-29: 2 via ORAL
  Filled 2019-03-28: qty 2

## 2019-03-28 MED ORDER — DIPHENHYDRAMINE HCL 25 MG PO CAPS: 25.0000 mg | ORAL_CAPSULE | Freq: Four times a day (QID) | ORAL | Status: DC | PRN

## 2019-03-28 MED ORDER — ONDANSETRON HCL 4 MG PO TABS: 4.0000 mg | ORAL_TABLET | ORAL | Status: DC | PRN

## 2019-03-28 MED ORDER — TETANUS-DIPHTH-ACELL PERTUSSIS 5-2.5-18.5 LF-MCG/0.5 IM SUSP: 0.5000 mL | Freq: Once | INTRAMUSCULAR | Status: DC

## 2019-03-28 MED ORDER — LACTATED RINGERS IV SOLN
INTRAVENOUS | Status: DC
  Administered 2019-03-28: 17:00:00 via INTRAVENOUS

## 2019-03-28 MED ORDER — METHYLERGONOVINE MALEATE 0.2 MG/ML IJ SOLN
INTRAMUSCULAR | Status: AC
  Administered 2019-03-28: 0.2 mg
  Filled 2019-03-28: qty 1

## 2019-03-28 MED ORDER — COCONUT OIL OIL: 1.0000 "application " | TOPICAL_OIL | Status: DC | PRN

## 2019-03-28 MED ORDER — MEASLES, MUMPS & RUBELLA VAC IJ SOLR: 0.5000 mL | Freq: Once | INTRAMUSCULAR | Status: DC

## 2019-03-28 MED ORDER — OXYTOCIN 40 UNITS IN NORMAL SALINE INFUSION - SIMPLE MED
2.5000 [IU]/h | INTRAVENOUS | Status: DC
  Filled 2019-03-28: qty 1000

## 2019-03-28 MED ORDER — SIMETHICONE 80 MG PO CHEW: 80.0000 mg | CHEWABLE_TABLET | ORAL | Status: DC | PRN

## 2019-03-28 MED ORDER — BENZOCAINE-MENTHOL 20-0.5 % EX AERO: 1.0000 "application " | INHALATION_SPRAY | CUTANEOUS | Status: DC | PRN

## 2019-03-28 MED ORDER — IBUPROFEN 600 MG PO TABS
600.0000 mg | ORAL_TABLET | Freq: Four times a day (QID) | ORAL | Status: DC
  Administered 2019-03-29 (×2): 600 mg via ORAL
  Filled 2019-03-28 (×3): qty 1

## 2019-03-28 MED ORDER — DIBUCAINE 1 % RE OINT: 1.0000 "application " | TOPICAL_OINTMENT | RECTAL | Status: DC | PRN

## 2019-03-28 MED ORDER — ONDANSETRON HCL 4 MG/2ML IJ SOLN: 4.0000 mg | Freq: Four times a day (QID) | INTRAMUSCULAR | Status: DC | PRN

## 2019-03-28 MED ORDER — LACTATED RINGERS IV SOLN: 500.0000 mL | INTRAVENOUS | Status: DC | PRN

## 2019-03-28 NOTE — Discharge Instructions (Signed)
Braxton Hicks Contractions Contractions of the uterus can occur throughout pregnancy, but they are not always a sign that you are in labor. You may have practice contractions called Braxton Hicks contractions. These false labor contractions are sometimes confused with true labor. What are Braxton Hicks contractions? Braxton Hicks contractions are tightening movements that occur in the muscles of the uterus before labor. Unlike true labor contractions, these contractions do not result in opening (dilation) and thinning of the cervix. Toward the end of pregnancy (32-34 weeks), Braxton Hicks contractions can happen more often and may become stronger. These contractions are sometimes difficult to tell apart from true labor because they can be very uncomfortable. You should not feel embarrassed if you go to the hospital with false labor. Sometimes, the only way to tell if you are in true labor is for your health care provider to look for changes in the cervix. The health care provider will do a physical exam and may monitor your contractions. If you are not in true labor, the exam should show that your cervix is not dilating and your water has not broken. If there are no other health problems associated with your pregnancy, it is completely safe for you to be sent home with false labor. You may continue to have Braxton Hicks contractions until you go into true labor. How to tell the difference between true labor and false labor True labor  Contractions last 30-70 seconds.  Contractions become very regular.  Discomfort is usually felt in the top of the uterus, and it spreads to the lower abdomen and low back.  Contractions do not go away with walking.  Contractions usually become more intense and increase in frequency.  The cervix dilates and gets thinner. False labor  Contractions are usually shorter and not as strong as true labor contractions.  Contractions are usually irregular.  Contractions  are often felt in the front of the lower abdomen and in the groin.  Contractions may go away when you walk around or change positions while lying down.  Contractions get weaker and are shorter-lasting as time goes on.  The cervix usually does not dilate or become thin. Follow these instructions at home:   Take over-the-counter and prescription medicines only as told by your health care provider.  Keep up with your usual exercises and follow other instructions from your health care provider.  Eat and drink lightly if you think you are going into labor.  If Braxton Hicks contractions are making you uncomfortable: ? Change your position from lying down or resting to walking, or change from walking to resting. ? Sit and rest in a tub of warm water. ? Drink enough fluid to keep your urine pale yellow. Dehydration may cause these contractions. ? Do slow and deep breathing several times an hour.  Keep all follow-up prenatal visits as told by your health care provider. This is important. Contact a health care provider if:  You have a fever.  You have continuous pain in your abdomen. Get help right away if:  Your contractions become stronger, more regular, and closer together.  You have fluid leaking or gushing from your vagina.  You pass blood-tinged mucus (bloody show).  You have bleeding from your vagina.  You have low back pain that you never had before.  You feel your baby's head pushing down and causing pelvic pressure.  Your baby is not moving inside you as much as it used to. Summary  Contractions that occur before labor are   called Braxton Hicks contractions, false labor, or practice contractions.  Braxton Hicks contractions are usually shorter, weaker, farther apart, and less regular than true labor contractions. True labor contractions usually become progressively stronger and regular, and they become more frequent.  Manage discomfort from Braxton Hicks contractions  by changing position, resting in a warm bath, drinking plenty of water, or practicing deep breathing. This information is not intended to replace advice given to you by your health care provider. Make sure you discuss any questions you have with your health care provider. Document Released: 04/26/2017 Document Revised: 09/25/2017 Document Reviewed: 04/26/2017 Elsevier Interactive Patient Education  2019 [employer]  

## 2019-03-28 NOTE — MAU Note (Signed)
Notified Philipp Deputy CNM- labor eval- cervical check, fhm strip reviewed. Received d/c orders.  D/C instructions given -labor instructions, pt voiced understanding. Encouraged to keep next prenatal visit is next Wednesday, unless back here first.   .

## 2019-03-28 NOTE — Discharge Summary (Signed)
OB Discharge Summary     Patient Name: Teresa Olsen DOB: 06-30-1991 MRN: [id number]  Date of admission: 03/28/2019 Delivering MD: Arvilla Market   Date of discharge: 03/29/2019  Admitting diagnosis: CTX Intrauterine pregnancy: [redacted]w[redacted]d     Secondary diagnosis:  Active Problems:   Indication for care in labor and delivery, antepartum   Anemia in pregnancy   PCOS (polycystic ovarian syndrome)  Additional problems: none     Discharge diagnosis: Term Pregnancy Delivered                                                                                                Post partum procedures:none  Augmentation: AROM  Complications: None  Hospital course:  Onset of Labor With Vaginal Delivery     28 y.o. yo [id number] at [redacted]w[redacted]d was admitted in Active Labor on 03/28/2019. Patient had an uncomplicated labor course as follows, including receiving methergine/Pit for initial uterine atony after placenta delivered (EBL 110cc). Membrane Rupture Time/Date: 6:07 PM ,03/28/2019   Intrapartum Procedures: Episiotomy: None [1]                                         Lacerations:  None [1]  Patient had a delivery of a Viable infant. 03/28/2019  Information for the patient's newborn:  Teresa Olsen, Girl Teresa Olsen [[id number]]  Delivery Method: Vag-Spont    Pateint had an uncomplicated postpartum course.  She is ambulating, tolerating a regular diet, passing flatus, and urinating well. Patient is discharged home in stable condition on 03/29/19 per her request for early discharge, as long as infant can go.   Physical exam  Vitals:   03/28/19 2110 03/29/19 0045 03/29/19 0535 03/29/19 0948  BP: 119/70 (!) 131/91 102/63 107/66  Pulse: 66 67 (!) 57 (!) 59  Resp: 18 18 18 19   Temp: 98.5 F (36.9 C) 98.2 F (36.8 C) 98.5 F (36.9 C) 98 F (36.7 C)  TempSrc: Oral Oral Oral   SpO2: 99% 99% 100%   Weight:      Height:       General: alert and cooperative Lochia: appropriate Uterine  Fundus: firm DVT Evaluation: No evidence of DVT seen on physical exam. Labs: Lab Results  Component Value Date   WBC 8.6 03/28/2019   HGB 9.5 (L) 03/28/2019   HCT 31.0 (L) 03/28/2019   MCV 85.2 03/28/2019   PLT 207 03/28/2019   CMP Latest Ref Rng & Units 02/07/2018  Glucose 65 - 99 mg/dL 97  BUN 6 - 20 mg/dL 9  Creatinine 0.93 - 8.18 mg/dL 2.99  Sodium 371 - 696 mmol/L 141  Potassium 3.5 - 5.1 mmol/L 4.0  Chloride 101 - 111 mmol/L 107  CO2 22 - 32 mmol/L 23  Calcium 8.9 - 10.3 mg/dL 9.1  Total Protein 6.5 - 8.1 g/dL 7.9  Total Bilirubin 0.3 - 1.2 mg/dL 0.8  Alkaline Phos 38 - 126 U/L 64  AST 15 - 41 U/L 22  ALT 14 - 54 U/L 31  Discharge instruction: per After Visit Summary and "Baby and Me Booklet".  After visit meds:  Allergies as of 03/29/2019   No Known Allergies     Medication List    TAKE these medications   [employer] PRENATAL GUMMY PO Take 1 tablet by mouth daily.   Ferrous Fumarate 324 (106 Fe) MG Tabs tablet Commonly known as:  HEMOCYTE - 106 mg FE Take 1 tablet (106 mg of iron total) by mouth daily.   ibuprofen 600 MG tablet Commonly known as:  ADVIL,MOTRIN Take 1 tablet (600 mg total) by mouth every 6 (six) hours as needed.       Diet: routine diet  Activity: Advance as tolerated. Pelvic rest for 6 weeks.   Outpatient follow up:4 weeks Follow up Appt:No future appointments. Follow up Visit:No follow-ups on file.  Postpartum contraception: IUD Mirena  Newborn Data: Live born female  Birth Weight: 6 lb 12.3 oz (3070 g) APGAR: 9, 10  Newborn Delivery   Birth date/time:  03/28/2019 18:20:00 Delivery type:  Vaginal, Spontaneous     Baby Feeding: Breast Disposition:home with mother   03/29/2019 Teresa Olsen, CNM  10:06 AM

## 2019-03-28 NOTE — H&P (Addendum)
LABOR AND DELIVERY ADMISSION HISTORY AND PHYSICAL NOTE  Regenna Tedrick is a 28 y.o. female [id number] with IUP at [redacted]w[redacted]d by LMP presenting for SOL.  She reports positive fetal movement. She denies leakage of fluid or vaginal bleeding.  Prenatal History/Complications: PNC at [facility] Pregnancy complications:  - ASCUS without HPV on PAP  -PCOS  -anemia during pregnancy  -cholelithiasis  -h/o PPH with prior delivery per patient   Past Medical History: Past Medical History:  Diagnosis Date  . Headache(784.0)   . PCOS (polycystic ovarian syndrome)     Past Surgical History: Past Surgical History:  Procedure Laterality Date  . TONSILLECTOMY      Obstetrical History: OB History    Gravida  7   Para  3   Term  3   Preterm  0   AB  3   Living  3     SAB  3   TAB  0   Ectopic  0   Multiple  0   Live Births  3           Social History: Social History   Socioeconomic History  . Marital status: Significant Other    Spouse name: Not on file  . Number of children: Not on file  . Years of education: Not on file  . Highest education level: Not on file  Occupational History  . Not on file  Social Needs  . Financial resource strain: Not on file  . Food insecurity:    Worry: Not on file    Inability: Not on file  . Transportation needs:    Medical: Not on file    Non-medical: Not on file  Tobacco Use  . Smoking status: Never Smoker  . Smokeless tobacco: Never Used  Substance and Sexual Activity  . Alcohol use: No  . Drug use: No  . Sexual activity: Yes    Birth control/protection: None  Lifestyle  . Physical activity:    Days per week: Not on file    Minutes per session: Not on file  . Stress: Not on file  Relationships  . Social connections:    Talks on phone: Not on file    Gets together: Not on file    Attends religious service: Not on file    Active member of club or organization: Not on file    Attends meetings of clubs or  organizations: Not on file    Relationship status: Not on file  Other Topics Concern  . Not on file  Social History Narrative  . Not on file    Family History: Family History  Problem Relation Age of Onset  . Hypertension Father   . Hyperlipidemia Mother   . Diabetes Mother   . Hearing loss Neg Hx     Allergies: No Known Allergies  Medications Prior to Admission  Medication Sig Dispense Refill Last Dose  . Prenatal Vit-Min-FA-Fish Oil ([employer] PRENATAL GUMMY PO) Take 1 tablet by mouth daily.   03/28/2019 at Unknown time     Review of Systems  All systems reviewed and negative except as stated in HPI  Physical Exam Blood pressure 132/68, pulse (!) 105, last menstrual period 06/25/2018, unknown if currently breastfeeding. General appearance: alert, oriented, NAD Lungs: normal respiratory effort Heart: regular rate Abdomen: soft, non-tender; gravid, FH appropriate for GA Extremities: No calf swelling or tenderness Presentation: cephalic Fetal monitoring: 135 bpm, moderate variability, +acels, early decels  Uterine activity: q2-3 min  Dilation: 6 Effacement (%):  100 Station: -2  Prenatal labs: ABO, Rh:  A positive  Antibody:  Negative  Rubella:  Immune  RPR:   Non-reactive  HBsAg:   Negative  HIV:   Non-reactive  GC/Chlamydia: Negative  GBS:   Negative  1-hr GTT: Normal  Genetic screening:  Negative  Anatomy US: Normal   Prenatal Transfer Tool  Maternal Diabetes: No Genetic Screening: Normal Maternal Ultrasounds/Referrals: Normal Fetal Ultrasounds or other Referrals:  None Maternal Substance Abuse:  No Significant Maternal Medications:  None Significant Maternal Lab Results: Lab values include: Group B Strep negative  No results found for this or any previous visit (from the past 24 hour(s)).  Patient Active Problem List   Diagnosis Date Noted  . Supervision of other normal pregnancy, antepartum 03/19/2019    Assessment: Fredie Gudmundson is a 28  y.o. [id number] at [redacted]w[redacted]d here for SOL.   #Labor: Expectant management. Patient has made cervical change on subsequent MAU visits today.  #Pain: Declines epidural.  #FWB: Cat I  #ID:  GBS neg  #MOF: Breast  #MOC:IUD  #Circ:  N/A   De Hollingshead 03/28/2019, 4:59 PM

## 2019-03-28 NOTE — MAU Note (Signed)
Contractions started yesterday.  Every 5 min since 6 am.  No leaking. Baby moving well. No bleeding. Mucus only.

## 2019-03-28 NOTE — MAU Note (Signed)
PT presents to MAU with complaints of contractions that started early this morning. Was evaluated in MAU this morning and sent home.

## 2019-03-29 DIAGNOSIS — O99019 Anemia complicating pregnancy, unspecified trimester: Secondary | ICD-10-CM | POA: Diagnosis present

## 2019-03-29 DIAGNOSIS — E282 Polycystic ovarian syndrome: Secondary | ICD-10-CM | POA: Clinically undetermined

## 2019-03-29 LAB — RPR: RPR Ser Ql: NONREACTIVE

## 2019-03-29 MED ORDER — FERROUS FUMARATE 324 (106 FE) MG PO TABS: 1.0000 | ORAL_TABLET | Freq: Every day | ORAL | 3 refills | Status: AC

## 2019-03-29 MED ORDER — IBUPROFEN 600 MG PO TABS: 600.0000 mg | ORAL_TABLET | Freq: Four times a day (QID) | ORAL | 0 refills | Status: AC | PRN

## 2019-03-29 NOTE — Lactation Note (Signed)
This note was copied from a baby's chart. Lactation Consultation Note  Patient Name: Teresa Olsen Date: 03/29/2019 Reason for consult: Initial assessment;Early term 14-38.6wks Baby is 17 hours old.  This is mom's fourth baby and she breastfed her previous babies for 18 months without difficulty.  She states that newborn is latching and feeding well.  Instructed to feed with any feeding cue and call for assist prn. Mom denies questions or concerns.  Breastfeeding consultation services and support information given.  Maternal Data Does the patient have breastfeeding experience prior to this delivery?: Yes  Feeding Feeding Type: Breast Fed  LATCH Score Latch: Grasps breast easily, tongue down, lips flanged, rhythmical sucking.  Audible Swallowing: Spontaneous and intermittent  Type of Nipple: Everted at rest and after stimulation  Comfort (Breast/Nipple): Soft / non-tender  Hold (Positioning): No assistance needed to correctly position infant at breast.  LATCH Score: 10  Interventions    Lactation Tools Discussed/Used     Consult Status Consult Status: Follow-up Date: 03/30/19 Follow-up type: In-patient    Huston Foley 03/29/2019, 11:46 AM

## 2019-03-29 NOTE — Progress Notes (Signed)
Discharged instruction and AVS document provided for mom and baby provided.  Patient verbalized understood.

## 2019-03-29 NOTE — Discharge Instructions (Signed)
Vaginal Delivery, Care After °Refer to this sheet in the next few weeks. These instructions provide you with information about caring for yourself after vaginal delivery. Your health care provider may also give you more specific instructions. Your treatment has been planned according to current medical practices, but problems sometimes occur. Call your health care provider if you have any problems or questions. °What can I expect after the procedure? °After vaginal delivery, it is common to have: °· Some bleeding from your vagina. °· Soreness in your abdomen, your vagina, and the area of skin between your vaginal opening and your anus (perineum). °· Pelvic cramps. °· Fatigue. °Follow these instructions at home: °Medicines °· Take over-the-counter and prescription medicines only as told by your health care provider. °· If you were prescribed an antibiotic medicine, take it as told by your health care provider. Do not stop taking the antibiotic until it is finished. °Driving ° °· Do not drive or operate heavy machinery while taking prescription pain medicine. °· Do not drive for 24 hours if you received a sedative. °Lifestyle °· Do not drink alcohol. This is especially important if you are breastfeeding or taking medicine to relieve pain. °· Do not use tobacco products, including cigarettes, chewing tobacco, or e-cigarettes. If you need help quitting, ask your health care provider. °Eating and drinking °· Drink at least 8 eight-ounce glasses of water every day unless you are told not to by your health care provider. If you choose to breastfeed your baby, you may need to drink more water than this. °· Eat high-fiber foods every day. These foods may help prevent or relieve constipation. High-fiber foods include: °? Whole grain cereals and breads. °? Brown rice. °? Beans. °? Fresh fruits and vegetables. °Activity °· Return to your normal activities as told by your health care provider. Ask your health care provider what  activities are safe for you. °· Rest as much as possible. Try to rest or take a nap when your baby is sleeping. °· Do not lift anything that is heavier than your baby or 10 lb (4.5 kg) until your health care provider says that it is safe. °· Talk with your health care provider about when you can engage in sexual activity. This may depend on your: °? Risk of infection. °? Rate of healing. °? Comfort and desire to engage in sexual activity. °Vaginal Care °· If you have an episiotomy or a vaginal tear, check the area every day for signs of infection. Check for: °? More redness, swelling, or pain. °? More fluid or blood. °? Warmth. °? Pus or a bad smell. °· Do not use tampons or douches until your health care provider says this is safe. °· Watch for any blood clots that may pass from your vagina. These may look like clumps of dark red, brown, or black discharge. °General instructions °· Keep your perineum clean and dry as told by your health care provider. °· Wear loose, comfortable clothing. °· Wipe from front to back when you use the toilet. °· Ask your health care provider if you can shower or take a bath. If you had an episiotomy or a perineal tear during labor and delivery, your health care provider may tell you not to take baths for a certain length of time. °· Wear a bra that supports your breasts and fits you well. °· If possible, have someone help you with household activities and help care for your baby for at least a few days after you   leave the hospital. °· Keep all follow-up visits for you and your baby as told by your health care provider. This is important. °Contact a health care provider if: °· You have: °? Vaginal discharge that has a bad smell. °? Difficulty urinating. °? Pain when urinating. °? A sudden increase or decrease in the frequency of your bowel movements. °? More redness, swelling, or pain around your episiotomy or vaginal tear. °? More fluid or blood coming from your episiotomy or vaginal  tear. °? Pus or a bad smell coming from your episiotomy or vaginal tear. °? A fever. °? A rash. °? Little or no interest in activities you used to enjoy. °? Questions about caring for yourself or your baby. °· Your episiotomy or vaginal tear feels warm to the touch. °· Your episiotomy or vaginal tear is separating or does not appear to be healing. °· Your breasts are painful, hard, or turn red. °· You feel unusually sad or worried. °· You feel nauseous or you vomit. °· You pass large blood clots from your vagina. If you pass a blood clot from your vagina, save it to show to your health care provider. Do not flush blood clots down the toilet without having your health care provider look at them. °· You urinate more than usual. °· You are dizzy or light-headed. °· You have not breastfed at all and you have not had a menstrual period for 12 weeks after delivery. °· You have stopped breastfeeding and you have not had a menstrual period for 12 weeks after you stopped breastfeeding. °Get help right away if: °· You have: °? Pain that does not go away or does not get better with medicine. °? Chest pain. °? Difficulty breathing. °? Blurred vision or spots in your vision. °? Thoughts about hurting yourself or your baby. °· You develop pain in your abdomen or in one of your legs. °· You develop a severe headache. °· You faint. °· You bleed from your vagina so much that you fill two sanitary pads in one hour. °This information is not intended to replace advice given to you by your health care provider. Make sure you discuss any questions you have with your health care provider. °Document Released: 12/08/2000 Document Revised: 05/24/2016 Document Reviewed: 12/26/2015 °Elsevier Interactive Patient Education © 2019 [employer] ° °

## 2019-06-30 ENCOUNTER — Encounter (HOSPITAL_COMMUNITY): Payer: Self-pay
